# Patient Record
Sex: Female | Born: 1935 | Race: White | Hispanic: No | Smoking: Never smoker
Health system: Southern US, Community
[De-identification: ages and names within clinical notes are randomized; demographics above are authoritative.]

## PROBLEM LIST (undated history)

## (undated) DIAGNOSIS — L719 Rosacea, unspecified: Secondary | ICD-10-CM

## (undated) DIAGNOSIS — M8580 Other specified disorders of bone density and structure, unspecified site: Secondary | ICD-10-CM

## (undated) DIAGNOSIS — I471 Supraventricular tachycardia, unspecified: Secondary | ICD-10-CM

## (undated) DIAGNOSIS — I351 Nonrheumatic aortic (valve) insufficiency: Secondary | ICD-10-CM

## (undated) DIAGNOSIS — K589 Irritable bowel syndrome without diarrhea: Secondary | ICD-10-CM

## (undated) DIAGNOSIS — K5792 Diverticulitis of intestine, part unspecified, without perforation or abscess without bleeding: Secondary | ICD-10-CM

## (undated) DIAGNOSIS — E039 Hypothyroidism, unspecified: Secondary | ICD-10-CM

## (undated) DIAGNOSIS — M51369 Other intervertebral disc degeneration, lumbar region without mention of lumbar back pain or lower extremity pain: Secondary | ICD-10-CM

## (undated) DIAGNOSIS — M5136 Other intervertebral disc degeneration, lumbar region: Secondary | ICD-10-CM

## (undated) DIAGNOSIS — I7300 Raynaud's syndrome without gangrene: Secondary | ICD-10-CM

## (undated) DIAGNOSIS — I872 Venous insufficiency (chronic) (peripheral): Secondary | ICD-10-CM

## (undated) DIAGNOSIS — F39 Unspecified mood [affective] disorder: Secondary | ICD-10-CM

## (undated) HISTORY — DX: Rosacea, unspecified: L71.9

## (undated) HISTORY — DX: Hypothyroidism, unspecified: E03.9

## (undated) HISTORY — DX: Diverticulitis of intestine, part unspecified, without perforation or abscess without bleeding: K57.92

## (undated) HISTORY — DX: Other intervertebral disc degeneration, lumbar region without mention of lumbar back pain or lower extremity pain: M51.369

## (undated) HISTORY — DX: Other specified disorders of bone density and structure, unspecified site: M85.80

## (undated) HISTORY — DX: Irritable bowel syndrome, unspecified: K58.9

## (undated) HISTORY — DX: Raynaud's syndrome without gangrene: I73.00

## (undated) HISTORY — DX: Other intervertebral disc degeneration, lumbar region: M51.36

## (undated) HISTORY — PX: TONSILLECTOMY AND ADENOIDECTOMY: SUR1326

## (undated) HISTORY — DX: Unspecified mood (affective) disorder: F39

---
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
## 2003-07-30 ENCOUNTER — Encounter: Payer: Self-pay | Admitting: Internal Medicine

## 2009-02-23 DIAGNOSIS — F39 Unspecified mood [affective] disorder: Secondary | ICD-10-CM

## 2009-02-23 HISTORY — DX: Unspecified mood (affective) disorder: F39

## 2010-09-02 DIAGNOSIS — M810 Age-related osteoporosis without current pathological fracture: Secondary | ICD-10-CM | POA: Insufficient documentation

## 2010-09-02 DIAGNOSIS — H9319 Tinnitus, unspecified ear: Secondary | ICD-10-CM | POA: Insufficient documentation

## 2011-10-06 DIAGNOSIS — H409 Unspecified glaucoma: Secondary | ICD-10-CM | POA: Insufficient documentation

## 2012-02-24 DIAGNOSIS — C4491 Basal cell carcinoma of skin, unspecified: Secondary | ICD-10-CM

## 2012-02-24 HISTORY — DX: Basal cell carcinoma of skin, unspecified: C44.91

## 2014-11-26 DIAGNOSIS — M2060 Acquired deformities of toe(s), unspecified, unspecified foot: Secondary | ICD-10-CM | POA: Insufficient documentation

## 2015-02-24 HISTORY — PX: MOHS SURGERY: SUR867

## 2016-02-18 DIAGNOSIS — R748 Abnormal levels of other serum enzymes: Secondary | ICD-10-CM | POA: Insufficient documentation

## 2016-02-25 DIAGNOSIS — R748 Abnormal levels of other serum enzymes: Secondary | ICD-10-CM | POA: Diagnosis not present

## 2016-02-25 DIAGNOSIS — G8929 Other chronic pain: Secondary | ICD-10-CM | POA: Diagnosis not present

## 2016-02-25 DIAGNOSIS — Z91018 Allergy to other foods: Secondary | ICD-10-CM | POA: Diagnosis not present

## 2016-02-25 DIAGNOSIS — M5442 Lumbago with sciatica, left side: Secondary | ICD-10-CM | POA: Diagnosis not present

## 2016-02-25 DIAGNOSIS — M62830 Muscle spasm of back: Secondary | ICD-10-CM | POA: Diagnosis not present

## 2016-02-25 DIAGNOSIS — M7989 Other specified soft tissue disorders: Secondary | ICD-10-CM | POA: Diagnosis not present

## 2016-03-03 DIAGNOSIS — G8929 Other chronic pain: Secondary | ICD-10-CM | POA: Diagnosis not present

## 2016-03-03 DIAGNOSIS — M5442 Lumbago with sciatica, left side: Secondary | ICD-10-CM | POA: Diagnosis not present

## 2016-03-03 DIAGNOSIS — M62830 Muscle spasm of back: Secondary | ICD-10-CM | POA: Diagnosis not present

## 2016-03-05 DIAGNOSIS — R600 Localized edema: Secondary | ICD-10-CM | POA: Diagnosis not present

## 2016-03-10 DIAGNOSIS — R600 Localized edema: Secondary | ICD-10-CM | POA: Diagnosis not present

## 2016-03-10 DIAGNOSIS — I082 Rheumatic disorders of both aortic and tricuspid valves: Secondary | ICD-10-CM | POA: Diagnosis not present

## 2016-03-25 DIAGNOSIS — K87 Disorders of gallbladder, biliary tract and pancreas in diseases classified elsewhere: Secondary | ICD-10-CM | POA: Diagnosis not present

## 2016-03-25 DIAGNOSIS — R1084 Generalized abdominal pain: Secondary | ICD-10-CM | POA: Diagnosis not present

## 2016-03-25 DIAGNOSIS — K838 Other specified diseases of biliary tract: Secondary | ICD-10-CM | POA: Diagnosis not present

## 2016-03-25 DIAGNOSIS — K8689 Other specified diseases of pancreas: Secondary | ICD-10-CM | POA: Diagnosis not present

## 2016-04-02 DIAGNOSIS — G8929 Other chronic pain: Secondary | ICD-10-CM | POA: Diagnosis not present

## 2016-04-02 DIAGNOSIS — M5442 Lumbago with sciatica, left side: Secondary | ICD-10-CM | POA: Diagnosis not present

## 2016-04-02 DIAGNOSIS — M62830 Muscle spasm of back: Secondary | ICD-10-CM | POA: Diagnosis not present

## 2016-04-06 DIAGNOSIS — R7989 Other specified abnormal findings of blood chemistry: Secondary | ICD-10-CM | POA: Diagnosis not present

## 2016-04-06 DIAGNOSIS — K589 Irritable bowel syndrome without diarrhea: Secondary | ICD-10-CM | POA: Diagnosis not present

## 2016-04-06 DIAGNOSIS — K769 Liver disease, unspecified: Secondary | ICD-10-CM | POA: Diagnosis not present

## 2016-04-06 DIAGNOSIS — K838 Other specified diseases of biliary tract: Secondary | ICD-10-CM | POA: Diagnosis not present

## 2016-04-06 DIAGNOSIS — E039 Hypothyroidism, unspecified: Secondary | ICD-10-CM | POA: Diagnosis not present

## 2016-04-06 DIAGNOSIS — K5732 Diverticulitis of large intestine without perforation or abscess without bleeding: Secondary | ICD-10-CM | POA: Diagnosis not present

## 2016-04-19 DIAGNOSIS — N281 Cyst of kidney, acquired: Secondary | ICD-10-CM | POA: Diagnosis not present

## 2016-04-19 DIAGNOSIS — R7989 Other specified abnormal findings of blood chemistry: Secondary | ICD-10-CM | POA: Diagnosis not present

## 2016-04-19 DIAGNOSIS — K838 Other specified diseases of biliary tract: Secondary | ICD-10-CM | POA: Diagnosis not present

## 2016-04-19 DIAGNOSIS — K7689 Other specified diseases of liver: Secondary | ICD-10-CM | POA: Diagnosis not present

## 2016-04-19 DIAGNOSIS — K862 Cyst of pancreas: Secondary | ICD-10-CM | POA: Diagnosis not present

## 2016-04-30 DIAGNOSIS — M7989 Other specified soft tissue disorders: Secondary | ICD-10-CM | POA: Diagnosis not present

## 2016-04-30 DIAGNOSIS — R5382 Chronic fatigue, unspecified: Secondary | ICD-10-CM | POA: Diagnosis not present

## 2016-04-30 DIAGNOSIS — M2550 Pain in unspecified joint: Secondary | ICD-10-CM | POA: Diagnosis not present

## 2016-04-30 DIAGNOSIS — R768 Other specified abnormal immunological findings in serum: Secondary | ICD-10-CM | POA: Diagnosis not present

## 2016-04-30 DIAGNOSIS — R55 Syncope and collapse: Secondary | ICD-10-CM | POA: Diagnosis not present

## 2016-04-30 DIAGNOSIS — G20 Parkinson's disease: Secondary | ICD-10-CM | POA: Diagnosis not present

## 2016-05-25 DIAGNOSIS — R55 Syncope and collapse: Secondary | ICD-10-CM | POA: Diagnosis not present

## 2016-05-25 DIAGNOSIS — R609 Edema, unspecified: Secondary | ICD-10-CM | POA: Diagnosis not present

## 2016-05-25 DIAGNOSIS — R5383 Other fatigue: Secondary | ICD-10-CM | POA: Diagnosis not present

## 2016-06-02 DIAGNOSIS — I872 Venous insufficiency (chronic) (peripheral): Secondary | ICD-10-CM | POA: Diagnosis not present

## 2016-06-11 DIAGNOSIS — R5383 Other fatigue: Secondary | ICD-10-CM | POA: Diagnosis not present

## 2016-06-15 DIAGNOSIS — R5383 Other fatigue: Secondary | ICD-10-CM | POA: Diagnosis not present

## 2016-06-15 DIAGNOSIS — R55 Syncope and collapse: Secondary | ICD-10-CM | POA: Diagnosis not present

## 2016-06-16 DIAGNOSIS — H401131 Primary open-angle glaucoma, bilateral, mild stage: Secondary | ICD-10-CM | POA: Diagnosis not present

## 2016-07-06 DIAGNOSIS — I351 Nonrheumatic aortic (valve) insufficiency: Secondary | ICD-10-CM | POA: Diagnosis not present

## 2016-07-06 DIAGNOSIS — I471 Supraventricular tachycardia: Secondary | ICD-10-CM | POA: Diagnosis not present

## 2016-07-06 DIAGNOSIS — I872 Venous insufficiency (chronic) (peripheral): Secondary | ICD-10-CM | POA: Diagnosis not present

## 2016-07-06 DIAGNOSIS — R531 Weakness: Secondary | ICD-10-CM | POA: Diagnosis not present

## 2016-10-12 DIAGNOSIS — I872 Venous insufficiency (chronic) (peripheral): Secondary | ICD-10-CM | POA: Diagnosis not present

## 2016-10-12 DIAGNOSIS — I471 Supraventricular tachycardia: Secondary | ICD-10-CM | POA: Diagnosis not present

## 2016-10-12 DIAGNOSIS — I351 Nonrheumatic aortic (valve) insufficiency: Secondary | ICD-10-CM | POA: Diagnosis not present

## 2016-10-27 DIAGNOSIS — K588 Other irritable bowel syndrome: Secondary | ICD-10-CM | POA: Diagnosis not present

## 2016-10-27 DIAGNOSIS — M85849 Other specified disorders of bone density and structure, unspecified hand: Secondary | ICD-10-CM | POA: Diagnosis not present

## 2016-10-27 DIAGNOSIS — M7989 Other specified soft tissue disorders: Secondary | ICD-10-CM | POA: Diagnosis not present

## 2016-10-27 DIAGNOSIS — M51369 Other intervertebral disc degeneration, lumbar region without mention of lumbar back pain or lower extremity pain: Secondary | ICD-10-CM | POA: Insufficient documentation

## 2016-10-27 DIAGNOSIS — M8580 Other specified disorders of bone density and structure, unspecified site: Secondary | ICD-10-CM | POA: Diagnosis not present

## 2016-10-27 DIAGNOSIS — Z87898 Personal history of other specified conditions: Secondary | ICD-10-CM | POA: Diagnosis not present

## 2016-10-27 DIAGNOSIS — R5383 Other fatigue: Secondary | ICD-10-CM | POA: Diagnosis not present

## 2016-10-27 DIAGNOSIS — M19042 Primary osteoarthritis, left hand: Secondary | ICD-10-CM | POA: Diagnosis not present

## 2016-10-27 DIAGNOSIS — R768 Other specified abnormal immunological findings in serum: Secondary | ICD-10-CM | POA: Diagnosis not present

## 2016-10-27 DIAGNOSIS — M5416 Radiculopathy, lumbar region: Secondary | ICD-10-CM | POA: Insufficient documentation

## 2016-10-27 DIAGNOSIS — M19041 Primary osteoarthritis, right hand: Secondary | ICD-10-CM | POA: Diagnosis not present

## 2016-10-27 DIAGNOSIS — Z8719 Personal history of other diseases of the digestive system: Secondary | ICD-10-CM | POA: Diagnosis not present

## 2016-10-27 DIAGNOSIS — M150 Primary generalized (osteo)arthritis: Secondary | ICD-10-CM | POA: Diagnosis not present

## 2016-10-27 DIAGNOSIS — E039 Hypothyroidism, unspecified: Secondary | ICD-10-CM | POA: Diagnosis not present

## 2016-10-27 DIAGNOSIS — M25541 Pain in joints of right hand: Secondary | ICD-10-CM | POA: Insufficient documentation

## 2016-10-27 DIAGNOSIS — Z85828 Personal history of other malignant neoplasm of skin: Secondary | ICD-10-CM | POA: Diagnosis not present

## 2016-10-27 DIAGNOSIS — M25842 Other specified joint disorders, left hand: Secondary | ICD-10-CM | POA: Diagnosis not present

## 2016-10-27 DIAGNOSIS — M5136 Other intervertebral disc degeneration, lumbar region: Secondary | ICD-10-CM | POA: Insufficient documentation

## 2016-10-27 DIAGNOSIS — L719 Rosacea, unspecified: Secondary | ICD-10-CM | POA: Diagnosis not present

## 2016-10-27 DIAGNOSIS — M159 Polyosteoarthritis, unspecified: Secondary | ICD-10-CM | POA: Insufficient documentation

## 2016-10-27 DIAGNOSIS — M25841 Other specified joint disorders, right hand: Secondary | ICD-10-CM | POA: Diagnosis not present

## 2016-10-27 DIAGNOSIS — M180 Bilateral primary osteoarthritis of first carpometacarpal joints: Secondary | ICD-10-CM | POA: Diagnosis not present

## 2016-11-10 DIAGNOSIS — R5383 Other fatigue: Secondary | ICD-10-CM | POA: Diagnosis not present

## 2016-11-10 DIAGNOSIS — M8580 Other specified disorders of bone density and structure, unspecified site: Secondary | ICD-10-CM | POA: Diagnosis not present

## 2016-11-10 DIAGNOSIS — Z85828 Personal history of other malignant neoplasm of skin: Secondary | ICD-10-CM | POA: Diagnosis not present

## 2016-11-10 DIAGNOSIS — M5136 Other intervertebral disc degeneration, lumbar region: Secondary | ICD-10-CM | POA: Diagnosis not present

## 2016-11-10 DIAGNOSIS — R768 Other specified abnormal immunological findings in serum: Secondary | ICD-10-CM | POA: Diagnosis not present

## 2016-11-10 DIAGNOSIS — E039 Hypothyroidism, unspecified: Secondary | ICD-10-CM | POA: Diagnosis not present

## 2016-11-10 DIAGNOSIS — R778 Other specified abnormalities of plasma proteins: Secondary | ICD-10-CM | POA: Insufficient documentation

## 2016-11-10 DIAGNOSIS — K588 Other irritable bowel syndrome: Secondary | ICD-10-CM | POA: Diagnosis not present

## 2016-11-10 DIAGNOSIS — M150 Primary generalized (osteo)arthritis: Secondary | ICD-10-CM | POA: Diagnosis not present

## 2016-11-10 DIAGNOSIS — Z87898 Personal history of other specified conditions: Secondary | ICD-10-CM | POA: Diagnosis not present

## 2016-11-10 DIAGNOSIS — M5416 Radiculopathy, lumbar region: Secondary | ICD-10-CM | POA: Diagnosis not present

## 2016-11-10 DIAGNOSIS — R21 Rash and other nonspecific skin eruption: Secondary | ICD-10-CM | POA: Insufficient documentation

## 2016-11-10 DIAGNOSIS — M25541 Pain in joints of right hand: Secondary | ICD-10-CM | POA: Diagnosis not present

## 2016-11-10 DIAGNOSIS — Z8719 Personal history of other diseases of the digestive system: Secondary | ICD-10-CM | POA: Diagnosis not present

## 2016-11-16 DIAGNOSIS — Z85828 Personal history of other malignant neoplasm of skin: Secondary | ICD-10-CM | POA: Diagnosis not present

## 2016-11-16 DIAGNOSIS — L821 Other seborrheic keratosis: Secondary | ICD-10-CM | POA: Diagnosis not present

## 2016-11-16 DIAGNOSIS — D1801 Hemangioma of skin and subcutaneous tissue: Secondary | ICD-10-CM | POA: Diagnosis not present

## 2016-12-04 DIAGNOSIS — M8580 Other specified disorders of bone density and structure, unspecified site: Secondary | ICD-10-CM | POA: Diagnosis not present

## 2016-12-04 DIAGNOSIS — M7989 Other specified soft tissue disorders: Secondary | ICD-10-CM | POA: Diagnosis not present

## 2016-12-04 DIAGNOSIS — E039 Hypothyroidism, unspecified: Secondary | ICD-10-CM | POA: Diagnosis not present

## 2016-12-04 DIAGNOSIS — Z23 Encounter for immunization: Secondary | ICD-10-CM | POA: Diagnosis not present

## 2016-12-04 DIAGNOSIS — R748 Abnormal levels of other serum enzymes: Secondary | ICD-10-CM | POA: Diagnosis not present

## 2016-12-04 DIAGNOSIS — R768 Other specified abnormal immunological findings in serum: Secondary | ICD-10-CM | POA: Diagnosis not present

## 2016-12-29 DIAGNOSIS — H2513 Age-related nuclear cataract, bilateral: Secondary | ICD-10-CM | POA: Diagnosis not present

## 2016-12-29 DIAGNOSIS — H401131 Primary open-angle glaucoma, bilateral, mild stage: Secondary | ICD-10-CM | POA: Diagnosis not present

## 2016-12-29 DIAGNOSIS — Z79899 Other long term (current) drug therapy: Secondary | ICD-10-CM | POA: Diagnosis not present

## 2017-02-04 DIAGNOSIS — Z8719 Personal history of other diseases of the digestive system: Secondary | ICD-10-CM | POA: Diagnosis not present

## 2017-02-04 DIAGNOSIS — E039 Hypothyroidism, unspecified: Secondary | ICD-10-CM | POA: Diagnosis not present

## 2017-02-04 DIAGNOSIS — R768 Other specified abnormal immunological findings in serum: Secondary | ICD-10-CM | POA: Diagnosis not present

## 2017-02-04 DIAGNOSIS — M5416 Radiculopathy, lumbar region: Secondary | ICD-10-CM | POA: Diagnosis not present

## 2017-02-04 DIAGNOSIS — Z87898 Personal history of other specified conditions: Secondary | ICD-10-CM | POA: Diagnosis not present

## 2017-02-04 DIAGNOSIS — I7300 Raynaud's syndrome without gangrene: Secondary | ICD-10-CM | POA: Diagnosis not present

## 2017-02-04 DIAGNOSIS — M5136 Other intervertebral disc degeneration, lumbar region: Secondary | ICD-10-CM | POA: Diagnosis not present

## 2017-02-04 DIAGNOSIS — K588 Other irritable bowel syndrome: Secondary | ICD-10-CM | POA: Diagnosis not present

## 2017-02-04 DIAGNOSIS — M150 Primary generalized (osteo)arthritis: Secondary | ICD-10-CM | POA: Diagnosis not present

## 2017-02-04 DIAGNOSIS — R5383 Other fatigue: Secondary | ICD-10-CM | POA: Diagnosis not present

## 2017-02-04 DIAGNOSIS — M25541 Pain in joints of right hand: Secondary | ICD-10-CM | POA: Diagnosis not present

## 2017-02-04 DIAGNOSIS — Z85828 Personal history of other malignant neoplasm of skin: Secondary | ICD-10-CM | POA: Diagnosis not present

## 2017-02-19 DIAGNOSIS — Z1231 Encounter for screening mammogram for malignant neoplasm of breast: Secondary | ICD-10-CM | POA: Diagnosis not present

## 2017-04-09 DIAGNOSIS — K8689 Other specified diseases of pancreas: Secondary | ICD-10-CM | POA: Diagnosis not present

## 2017-04-09 DIAGNOSIS — K838 Other specified diseases of biliary tract: Secondary | ICD-10-CM | POA: Diagnosis not present

## 2017-04-09 DIAGNOSIS — K862 Cyst of pancreas: Secondary | ICD-10-CM | POA: Diagnosis not present

## 2017-04-19 DIAGNOSIS — I351 Nonrheumatic aortic (valve) insufficiency: Secondary | ICD-10-CM | POA: Diagnosis not present

## 2017-04-19 DIAGNOSIS — R531 Weakness: Secondary | ICD-10-CM | POA: Diagnosis not present

## 2017-04-19 DIAGNOSIS — I872 Venous insufficiency (chronic) (peripheral): Secondary | ICD-10-CM | POA: Diagnosis not present

## 2017-04-19 DIAGNOSIS — R0602 Shortness of breath: Secondary | ICD-10-CM | POA: Diagnosis not present

## 2017-04-19 DIAGNOSIS — I471 Supraventricular tachycardia: Secondary | ICD-10-CM | POA: Diagnosis not present

## 2017-04-20 DIAGNOSIS — H401131 Primary open-angle glaucoma, bilateral, mild stage: Secondary | ICD-10-CM | POA: Diagnosis not present

## 2017-04-20 DIAGNOSIS — H2513 Age-related nuclear cataract, bilateral: Secondary | ICD-10-CM | POA: Diagnosis not present

## 2017-04-20 DIAGNOSIS — Z79899 Other long term (current) drug therapy: Secondary | ICD-10-CM | POA: Diagnosis not present

## 2017-05-24 DIAGNOSIS — Z85828 Personal history of other malignant neoplasm of skin: Secondary | ICD-10-CM | POA: Diagnosis not present

## 2017-05-24 DIAGNOSIS — L821 Other seborrheic keratosis: Secondary | ICD-10-CM | POA: Diagnosis not present

## 2017-05-24 DIAGNOSIS — D225 Melanocytic nevi of trunk: Secondary | ICD-10-CM | POA: Diagnosis not present

## 2017-05-24 DIAGNOSIS — L820 Inflamed seborrheic keratosis: Secondary | ICD-10-CM | POA: Diagnosis not present

## 2017-05-31 DIAGNOSIS — Z87898 Personal history of other specified conditions: Secondary | ICD-10-CM | POA: Diagnosis not present

## 2017-05-31 DIAGNOSIS — F439 Reaction to severe stress, unspecified: Secondary | ICD-10-CM | POA: Insufficient documentation

## 2017-05-31 DIAGNOSIS — R768 Other specified abnormal immunological findings in serum: Secondary | ICD-10-CM | POA: Diagnosis not present

## 2017-05-31 DIAGNOSIS — E039 Hypothyroidism, unspecified: Secondary | ICD-10-CM | POA: Diagnosis not present

## 2017-05-31 DIAGNOSIS — M25541 Pain in joints of right hand: Secondary | ICD-10-CM | POA: Diagnosis not present

## 2017-05-31 DIAGNOSIS — R5383 Other fatigue: Secondary | ICD-10-CM | POA: Diagnosis not present

## 2017-05-31 DIAGNOSIS — M5136 Other intervertebral disc degeneration, lumbar region: Secondary | ICD-10-CM | POA: Diagnosis not present

## 2017-05-31 DIAGNOSIS — Z8719 Personal history of other diseases of the digestive system: Secondary | ICD-10-CM | POA: Insufficient documentation

## 2017-05-31 DIAGNOSIS — K588 Other irritable bowel syndrome: Secondary | ICD-10-CM | POA: Diagnosis not present

## 2017-05-31 DIAGNOSIS — M5416 Radiculopathy, lumbar region: Secondary | ICD-10-CM | POA: Diagnosis not present

## 2017-05-31 DIAGNOSIS — R1319 Other dysphagia: Secondary | ICD-10-CM | POA: Insufficient documentation

## 2017-05-31 DIAGNOSIS — Z85828 Personal history of other malignant neoplasm of skin: Secondary | ICD-10-CM | POA: Diagnosis not present

## 2017-05-31 DIAGNOSIS — M150 Primary generalized (osteo)arthritis: Secondary | ICD-10-CM | POA: Diagnosis not present

## 2017-05-31 DIAGNOSIS — I7300 Raynaud's syndrome without gangrene: Secondary | ICD-10-CM | POA: Diagnosis not present

## 2017-06-24 DIAGNOSIS — K229 Disease of esophagus, unspecified: Secondary | ICD-10-CM | POA: Diagnosis not present

## 2017-06-24 DIAGNOSIS — K3189 Other diseases of stomach and duodenum: Secondary | ICD-10-CM | POA: Diagnosis not present

## 2017-06-24 DIAGNOSIS — K228 Other specified diseases of esophagus: Secondary | ICD-10-CM | POA: Diagnosis not present

## 2017-06-24 DIAGNOSIS — K449 Diaphragmatic hernia without obstruction or gangrene: Secondary | ICD-10-CM | POA: Diagnosis not present

## 2017-06-24 DIAGNOSIS — R1310 Dysphagia, unspecified: Secondary | ICD-10-CM | POA: Diagnosis not present

## 2017-06-24 DIAGNOSIS — R109 Unspecified abdominal pain: Secondary | ICD-10-CM | POA: Diagnosis not present

## 2017-06-30 DIAGNOSIS — E039 Hypothyroidism, unspecified: Secondary | ICD-10-CM | POA: Diagnosis not present

## 2017-06-30 DIAGNOSIS — R748 Abnormal levels of other serum enzymes: Secondary | ICD-10-CM | POA: Diagnosis not present

## 2017-06-30 DIAGNOSIS — R5383 Other fatigue: Secondary | ICD-10-CM | POA: Diagnosis not present

## 2017-06-30 DIAGNOSIS — M25541 Pain in joints of right hand: Secondary | ICD-10-CM | POA: Diagnosis not present

## 2017-06-30 DIAGNOSIS — M25542 Pain in joints of left hand: Secondary | ICD-10-CM | POA: Diagnosis not present

## 2017-06-30 DIAGNOSIS — M8580 Other specified disorders of bone density and structure, unspecified site: Secondary | ICD-10-CM | POA: Diagnosis not present

## 2017-06-30 DIAGNOSIS — R768 Other specified abnormal immunological findings in serum: Secondary | ICD-10-CM | POA: Diagnosis not present

## 2017-06-30 DIAGNOSIS — I7300 Raynaud's syndrome without gangrene: Secondary | ICD-10-CM | POA: Diagnosis not present

## 2017-06-30 DIAGNOSIS — R1084 Generalized abdominal pain: Secondary | ICD-10-CM | POA: Diagnosis not present

## 2017-06-30 DIAGNOSIS — H409 Unspecified glaucoma: Secondary | ICD-10-CM | POA: Diagnosis not present

## 2017-07-21 DIAGNOSIS — F4320 Adjustment disorder, unspecified: Secondary | ICD-10-CM | POA: Diagnosis not present

## 2017-11-23 DIAGNOSIS — H401131 Primary open-angle glaucoma, bilateral, mild stage: Secondary | ICD-10-CM | POA: Diagnosis not present

## 2017-11-23 DIAGNOSIS — Z79899 Other long term (current) drug therapy: Secondary | ICD-10-CM | POA: Diagnosis not present

## 2017-11-23 DIAGNOSIS — H2513 Age-related nuclear cataract, bilateral: Secondary | ICD-10-CM | POA: Diagnosis not present

## 2017-12-08 DIAGNOSIS — L709 Acne, unspecified: Secondary | ICD-10-CM | POA: Diagnosis not present

## 2017-12-08 DIAGNOSIS — L814 Other melanin hyperpigmentation: Secondary | ICD-10-CM | POA: Diagnosis not present

## 2017-12-08 DIAGNOSIS — L719 Rosacea, unspecified: Secondary | ICD-10-CM | POA: Diagnosis not present

## 2017-12-09 DIAGNOSIS — Z23 Encounter for immunization: Secondary | ICD-10-CM | POA: Diagnosis not present

## 2017-12-15 ENCOUNTER — Ambulatory Visit (INDEPENDENT_AMBULATORY_CARE_PROVIDER_SITE_OTHER): Payer: Medicare Other | Admitting: Internal Medicine

## 2017-12-15 ENCOUNTER — Encounter: Payer: Self-pay | Admitting: Internal Medicine

## 2017-12-15 ENCOUNTER — Ambulatory Visit: Payer: Self-pay | Admitting: Internal Medicine

## 2017-12-15 ENCOUNTER — Encounter (INDEPENDENT_AMBULATORY_CARE_PROVIDER_SITE_OTHER): Payer: Self-pay

## 2017-12-15 VITALS — BP 128/74 | HR 65 | Temp 97.6°F | Ht 64.75 in | Wt 103.0 lb

## 2017-12-15 DIAGNOSIS — E039 Hypothyroidism, unspecified: Secondary | ICD-10-CM

## 2017-12-15 DIAGNOSIS — L719 Rosacea, unspecified: Secondary | ICD-10-CM | POA: Diagnosis not present

## 2017-12-15 DIAGNOSIS — E441 Mild protein-calorie malnutrition: Secondary | ICD-10-CM

## 2017-12-15 DIAGNOSIS — F39 Unspecified mood [affective] disorder: Secondary | ICD-10-CM | POA: Insufficient documentation

## 2017-12-15 DIAGNOSIS — K589 Irritable bowel syndrome without diarrhea: Secondary | ICD-10-CM | POA: Diagnosis not present

## 2017-12-15 DIAGNOSIS — B3781 Candidal esophagitis: Secondary | ICD-10-CM | POA: Diagnosis not present

## 2017-12-15 DIAGNOSIS — I7300 Raynaud's syndrome without gangrene: Secondary | ICD-10-CM

## 2017-12-15 DIAGNOSIS — K2970 Gastritis, unspecified, without bleeding: Secondary | ICD-10-CM

## 2017-12-15 LAB — CBC
HCT: 39.3 % (ref 36.0–46.0)
HEMOGLOBIN: 13.3 g/dL (ref 12.0–15.0)
MCHC: 33.8 g/dL (ref 30.0–36.0)
MCV: 98.3 fl (ref 78.0–100.0)
Platelets: 200 10*3/uL (ref 150.0–400.0)
RBC: 3.99 Mil/uL (ref 3.87–5.11)
RDW: 12.6 % (ref 11.5–15.5)
WBC: 5.4 10*3/uL (ref 4.0–10.5)

## 2017-12-15 LAB — COMPREHENSIVE METABOLIC PANEL
ALT: 24 U/L (ref 0–35)
AST: 27 U/L (ref 0–37)
Albumin: 4.8 g/dL (ref 3.5–5.2)
Alkaline Phosphatase: 76 U/L (ref 39–117)
BILIRUBIN TOTAL: 0.4 mg/dL (ref 0.2–1.2)
BUN: 15 mg/dL (ref 6–23)
CALCIUM: 10.2 mg/dL (ref 8.4–10.5)
CHLORIDE: 101 meq/L (ref 96–112)
CO2: 34 meq/L — AB (ref 19–32)
CREATININE: 0.68 mg/dL (ref 0.40–1.20)
GFR: 88.24 mL/min (ref >60.00)
GLUCOSE: 94 mg/dL (ref 70–99)
Potassium: 4.6 mEq/L (ref 3.5–5.1)
Sodium: 140 mEq/L (ref 135–145)
Total Protein: 7.5 g/dL (ref 6.0–8.3)

## 2017-12-15 LAB — T4, FREE: Free T4: 0.78 ng/dL (ref 0.60–1.60)

## 2017-12-15 LAB — TSH: TSH: 0.91 u[IU]/mL (ref 0.35–4.50)

## 2017-12-15 MED ORDER — FLUCONAZOLE 100 MG PO TABS: 100.0000 mg | ORAL_TABLET | Freq: Every day | ORAL | 3 refills | Status: DC

## 2017-12-15 MED ORDER — FAMOTIDINE 20 MG PO TABS: 20.0000 mg | ORAL_TABLET | Freq: Two times a day (BID) | ORAL | 11 refills | Status: DC

## 2017-12-15 NOTE — Assessment & Plan Note (Signed)
Recent weight loss Discussed proper eating, etc

## 2017-12-15 NOTE — Assessment & Plan Note (Signed)
Guilt, grieving, etc Discussed considering counseling--but she doesn't "open up" easily

## 2017-12-15 NOTE — Assessment & Plan Note (Signed)
Need to check labs to be sure euthryoid

## 2017-12-15 NOTE — Progress Notes (Signed)
Subjective:    Patient ID: Robyn Washington, female    DOB: [birth year], 83 y.o.   MRN: [id number]  HPI Here to establish care I have met her caring for her husband at [facility]---he recently died She moved into one of the garden homes  Had increasing caregiver stress --which peaked last year in fall Was seeing physician about this Some increased LFTs---no alcohol. Got MRI --cysts or hamartomas Still has nausea--relates to the stress  Fatigue and arthralgias ANA positive and Raynaud's They tried plaquenil--didn't help She feels it is her immune system---reacting to a lot of food, etc EGD done--plaques seen suspicious for candida. Got fluconazole and she felt better after this for a while  Chronic abdominal problems Severe IBS at times Required hospitalization in 2013 Duodenal ulcer decades ago  Rosacea chronically Uses the doxycycline rarely for flares (like 3 days in a row and then none for months)  Hypothyroidism goes back to teenage years Long standing replacement  Also with glaucoma On topical Rx for this  Fairly socially isolated since her husband worsening Stuck to the caregiver role Plans to work on getting involved in things again Was always big on exercising---very limited with husband's illness. Has restarted with this No local church community --but faith is very important to her  Current Outpatient Medications on File Prior to Visit  Medication Sig Dispense Refill  . Calcium Carbonate-Vit D-Min (CALCIUM 600+D PLUS MINERALS) 600-400 MG-UNIT TABS Take by mouth.    . doxycycline (VIBRAMYCIN) 100 MG capsule Take by mouth.    . fexofenadine (ALLEGRA) 180 MG tablet Take by mouth.    . levothyroxine (SYNTHROID) 75 MCG tablet TAKE 1 TABLET BY MOUTH  DAILY    . Multiple Vitamins-Minerals (CENTRUM SILVER) tablet Take by mouth.    . timolol (TIMOPTIC) 0.5 % ophthalmic solution      No current facility-administered medications on file prior to visit.      Allergies  Allergen Reactions  . Bioflavonoids Swelling    Swelling of lips Swelling of lips   . Banana   . Barley Grass Other (See Comments)    Other reaction(s): Other (See Comments)  . Pear Other (See Comments)    Other reaction(s): Other (See Comments) Feels bad   . Pineapple   . Rice Other (See Comments)    Rhinitis. Rhinitis.   . Rye Grass Flower Pollen Extract  [Gramineae Pollens] Other (See Comments)  . Wheat Bran Other (See Comments)  . Hydroxychloroquine Rash    Past Medical History:  Diagnosis Date  . Diverticulitis    hospitalized 2013  . Hypothyroidism   . IBS (irritable bowel syndrome)   . Lumbar degenerative disc disease   . Mood disorder ([place]) 2011   adjustment issues mostly to moving and husband's illness  . Osteopenia   . Raynaud's syndrome   . Rosacea     Past Surgical History:  Procedure Laterality Date  . PILONIDAL CYST EXCISION  1958  . TONSILLECTOMY AND ADENOIDECTOMY      Family History  Problem Relation Age of Onset  . Diabetes Mother   . Alcohol abuse Father   . Thyroid cancer Daughter     Social History   Socioeconomic History  . Marital status: Widowed    Spouse name: Not on file  . Number of children: 2  . Years of education: Not on file  . Highest education level: Not on file  Occupational History  . Occupation: Web designer    Comment: [employer]  Social Needs  . Financial resource strain: Not on file  . Food insecurity:    Worry: Not on file    Inability: Not on file  . Transportation needs:    Medical: Not on file    Non-medical: Not on file  Tobacco Use  . Smoking status: Never Smoker  . Smokeless tobacco: Never Used  Substance and Sexual Activity  . Alcohol use: Never    Frequency: Never  . Drug use: Not on file  . Sexual activity: Not on file  Lifestyle  . Physical activity:    Days per week: Not on file    Minutes per session: Not on file  . Stress: Not on file   Relationships  . Social connections:    Talks on phone: Not on file    Gets together: Not on file    Attends religious service: Not on file    Active member of club or organization: Not on file    Attends meetings of clubs or organizations: Not on file    Relationship status: Not on file  . Intimate partner violence:    Fear of current or ex partner: Not on file    Emotionally abused: Not on file    Physically abused: Not on file    Forced sexual activity: Not on file  Other Topics Concern  . Not on file  Social History Narrative   2 daughters      Has living will   Daughter Robyn Washington Warehouse manager) then Robyn Washington [facility]) have health care POA   Would accept resuscitation but no prolonged ventilation or tube feeds   Review of Systems  Constitutional:       Thinks she is eating okay--but thinks she may have lost some weight  HENT: Negative for hearing loss.        Teeth okay---establishing with Dr Eugenie Birks  Respiratory: Negative for cough, chest tightness and shortness of breath.   Cardiovascular: Negative for chest pain and palpitations.       Knows heart skips but doesn't feel it Uses compression hose  Gastrointestinal: Positive for abdominal distention and abdominal pain.       No heartburn but does notice swallowing issues Did have inflammation noted in stomach at EGD Bloating, etc  Genitourinary: Negative for dysuria and hematuria.  Musculoskeletal:       Spasm and pain in back at times Aleve may have helped  Uses heating pad  Skin:       Doxy prn for rosacea Past cancer  Neurological: Negative for dizziness, syncope, light-headedness and headaches.       Feels weak  Psychiatric/Behavioral: Negative for sleep disturbance.       Doesn't think she is truly depressed       Objective:   Physical Exam  Constitutional: She is oriented to person, place, and time. No distress.  Slight build Poor muscle defnition  HENT:  Mouth/Throat: Oropharynx is clear and moist.  No oropharyngeal exudate.  Neck: No thyromegaly present.  Cardiovascular: Normal rate, regular rhythm, normal heart sounds and intact distal pulses. Exam reveals no gallop.  No murmur heard. Respiratory: Effort normal and breath sounds normal. No respiratory distress. She has no wheezes. She has no rales.  GI: Soft. Bowel sounds are normal. She exhibits no distension. There is no tenderness. There is no rebound and no guarding.  Musculoskeletal: She exhibits no edema or tenderness.  No synovitis  Lymphadenopathy:    She has no cervical adenopathy.  Neurological:  She is alert and oriented to person, place, and time.  Skin: No rash noted. No erythema.  Psychiatric: She has a normal mood and affect. Her behavior is normal.           Assessment & Plan:

## 2017-12-15 NOTE — Assessment & Plan Note (Signed)
Plaques seen on EGD Felt better after fluconazole---will try this again

## 2017-12-15 NOTE — Assessment & Plan Note (Signed)
Chronic issues Worse with stress

## 2017-12-15 NOTE — Assessment & Plan Note (Signed)
Seen on EGD Unclear how much of this is causing her symptoms Will try famotidine for now

## 2017-12-30 ENCOUNTER — Telehealth: Payer: Self-pay | Admitting: Internal Medicine

## 2017-12-30 NOTE — Telephone Encounter (Signed)
Please let her know that it is debatable about whether continuing mammograms after age 83 is a good idea. It has only been a year also---- and I think every other year is fine. She should probably wait on the mammogram till we discuss it again Similarly, her bone density was not quite 3 years ago and only showed osteopenia. Doing this again is unlikely to change any plan----I recommend discussing this also before proceeding.  I reviewed her last MRI and the recommendation is to continue yearly checks for now to monitor the pancreatic cyst (so I am okay with proceeding with this)

## 2017-12-30 NOTE — Telephone Encounter (Signed)
Best number [phone]  Pt called concerned about her appointments coming up  @ [facility]    She has a mammogram schedule 02/21/18 and its time to have her bone density she had her last one @ [facility] and wanted to know if she could go back there to have this done.  She knows that having the bone density done at the same place is best to compare. Will need order for bone density.  She also has a liver dr.  Dr Monica Martinez @ [facility] She doesn't have a follow up appointment scheduled with him.  But does have a repeat MRI scheduled @ [facility] 04/08/18.  She wanted to make sure you supported this so insurance will pay.    She kept referring back to my chart message about colonoscopy and cancer screening not done after 82.  Patient has appointment with you 11/25.

## 2017-12-31 NOTE — Telephone Encounter (Signed)
Spoke to pt. She said she needs to process all of this and will discuss it at the [place].

## 2018-01-17 ENCOUNTER — Ambulatory Visit: Payer: Medicare Other | Admitting: Internal Medicine

## 2018-01-24 DIAGNOSIS — H0102A Squamous blepharitis right eye, upper and lower eyelids: Secondary | ICD-10-CM | POA: Diagnosis not present

## 2018-01-24 DIAGNOSIS — H16223 Keratoconjunctivitis sicca, not specified as Sjogren's, bilateral: Secondary | ICD-10-CM | POA: Diagnosis not present

## 2018-01-24 NOTE — Telephone Encounter (Signed)
Pt called to ck on mychart request for refill synthroid 75 mcg taking one daily name brand to [facility]. Pt requesting refill so does not run out of med.Please advise.

## 2018-01-26 MED ORDER — SYNTHROID 75 MCG PO TABS: 75.0000 ug | ORAL_TABLET | Freq: Every day | ORAL | 3 refills | Status: DC

## 2018-01-26 NOTE — Telephone Encounter (Signed)
Pt called back to request synthroid to [facility]. Robyn Washington CMA said she will send today. Pt very appreciative and voiced understanding.

## 2018-01-28 ENCOUNTER — Encounter: Payer: Self-pay | Admitting: Internal Medicine

## 2018-01-28 ENCOUNTER — Ambulatory Visit (INDEPENDENT_AMBULATORY_CARE_PROVIDER_SITE_OTHER): Payer: Medicare Other | Admitting: Internal Medicine

## 2018-01-28 VITALS — BP 124/84 | HR 67 | Temp 97.6°F | Ht 64.75 in | Wt 106.0 lb

## 2018-01-28 DIAGNOSIS — K589 Irritable bowel syndrome without diarrhea: Secondary | ICD-10-CM | POA: Diagnosis not present

## 2018-01-28 DIAGNOSIS — F39 Unspecified mood [affective] disorder: Secondary | ICD-10-CM

## 2018-01-28 NOTE — Assessment & Plan Note (Signed)
Ongoing grieving Not great about getting out socially but has seen some of the people she knew from health care (when daughter was here)---but progressing  Doesn't want counselor No Rx for this

## 2018-01-28 NOTE — Assessment & Plan Note (Signed)
Ongoing symptoms Discussed regular miralax to keep bowels moving No other Rx for now

## 2018-01-28 NOTE — Progress Notes (Signed)
Subjective:    Patient ID: Robyn Washington, female    DOB: 10-14-1934, 83 y.o.   MRN: [id number]  HPI He for follow up of multiple issues She feels "my immune system is acting up" IBS is worse, notices the Raynaud's more, etc Ongoing abdominal pain Thinks the repeat Rx with fluconazole --helped upper symptoms Gets burning and distention, then bloating in epigastrium after eating Has tried miralax--it helps (3 times over 6 days) Knows that all her stress goes to her GI system  Still having a hard time Moved, adjusting to caregiving, then putting husband then dying Does go to exercise--but not working to meet people Cries a lot---various triggers Goes to [employer] a lot--even though she doesn't feel good when she does  Current Outpatient Medications on File Prior to Visit  Medication Sig Dispense Refill  . Calcium Carbonate-Vit D-Min (CALCIUM 600+D PLUS MINERALS) 600-400 MG-UNIT TABS Take by mouth.    . doxycycline (VIBRAMYCIN) 100 MG capsule Take by mouth.    . famotidine (PEPCID) 20 MG tablet Take 1 tablet (20 mg total) by mouth 2 (two) times daily. 60 tablet 11  . fexofenadine (ALLEGRA) 180 MG tablet Take by mouth.    . Multiple Vitamins-Minerals (CENTRUM SILVER) tablet Take by mouth.    . SYNTHROID 75 MCG tablet Take 1 tablet (75 mcg total) by mouth daily. 90 tablet 3  . timolol (TIMOPTIC) 0.5 % ophthalmic solution      No current facility-administered medications on file prior to visit.     Allergies  Allergen Reactions  . Bioflavonoids Swelling    Swelling of lips Swelling of lips   . Banana   . Barley Grass Other (See Comments)    Other reaction(s): Other (See Comments)  . Pear Other (See Comments)    Other reaction(s): Other (See Comments) Feels bad   . Pineapple   . Rice Other (See Comments)    Rhinitis. Rhinitis.   . Rye Grass Flower Pollen Extract  [Gramineae Pollens] Other (See Comments)  . Wheat Bran Other (See Comments)  . Hydroxychloroquine Rash     Past Medical History:  Diagnosis Date  . Diverticulitis    hospitalized 2013  . Hypothyroidism   . IBS (irritable bowel syndrome)   . Lumbar degenerative disc disease   . Mood disorder ([place]) 2011   adjustment issues mostly to moving and husband's illness  . Osteopenia   . Raynaud's syndrome   . Rosacea     Past Surgical History:  Procedure Laterality Date  . MOHS SURGERY  2017   BSC?  Marland Kitchen PILONIDAL CYST EXCISION  1958  . TONSILLECTOMY AND ADENOIDECTOMY      Family History  Problem Relation Age of Onset  . Diabetes Mother   . Alcohol abuse Father   . Thyroid cancer Daughter     Social History   Socioeconomic History  . Marital status: Widowed    Spouse name: Not on file  . Number of children: 2  . Years of education: Not on file  . Highest education level: Not on file  Occupational History  . Occupation: Web designer    Comment: [place]  . Financial resource strain: Not on file  . Food insecurity:    Worry: Not on file    Inability: Not on file  . Transportation needs:    Medical: Not on file    Non-medical: Not on file  Tobacco Use  . Smoking status: Never Smoker  . Smokeless tobacco:  Never Used  Substance and Sexual Activity  . Alcohol use: Never    Frequency: Never  . Drug use: Not on file  . Sexual activity: Not on file  Lifestyle  . Physical activity:    Days per week: Not on file    Minutes per session: Not on file  . Stress: Not on file  Relationships  . Social connections:    Talks on phone: Not on file    Gets together: Not on file    Attends religious service: Not on file    Active member of club or organization: Not on file    Attends meetings of clubs or organizations: Not on file    Relationship status: Not on file  . Intimate partner violence:    Fear of current or ex partner: Not on file    Emotionally abused: Not on file    Physically abused: Not on file    Forced sexual activity: Not  on file  Other Topics Concern  . Not on file  Social History Narrative   2 daughters      Has living will   Daughter Benjamine Mola Warehouse manager) then Ivin Booty [facility]) have health care POA   Would accept resuscitation but no prolonged ventilation or tube feeds   Review of Systems Eating okay--doesn't like the ensure Weight is up slightly    Objective:   Physical Exam  Constitutional: No distress.  Cardiovascular: Normal rate, regular rhythm and normal heart sounds. Exam reveals no gallop.  No murmur heard. Respiratory: Effort normal and breath sounds normal. No respiratory distress. She has no wheezes. She has no rales.  GI: Soft.  Tenderness in RLQ but no rebound and not new  Musculoskeletal: She exhibits no edema.           Assessment & Plan:

## 2018-02-03 DIAGNOSIS — R109 Unspecified abdominal pain: Secondary | ICD-10-CM

## 2018-02-09 DIAGNOSIS — H2513 Age-related nuclear cataract, bilateral: Secondary | ICD-10-CM | POA: Diagnosis not present

## 2018-02-21 ENCOUNTER — Telehealth: Payer: Self-pay | Admitting: Internal Medicine

## 2018-02-21 NOTE — Telephone Encounter (Signed)
Spoke to pt. She will try the Miralax 1-2 times a day until regular then 1 a day. She is scheduled to see GI in the next month.

## 2018-02-21 NOTE — Telephone Encounter (Signed)
Pt called concerning IBS. She has had a recent flare up and is wanting to know if there is anything OTC that she can get that will give relief. She is bloated and has discomfort after any foods.

## 2018-02-21 NOTE — Telephone Encounter (Signed)
Probiotics sometimes will help. Some people find fiber supplements helpful---but not consistently  If constipated, miralax may help as well

## 2018-03-04 DIAGNOSIS — M8588 Other specified disorders of bone density and structure, other site: Secondary | ICD-10-CM | POA: Diagnosis not present

## 2018-03-04 DIAGNOSIS — R109 Unspecified abdominal pain: Secondary | ICD-10-CM | POA: Diagnosis not present

## 2018-03-04 DIAGNOSIS — K5900 Constipation, unspecified: Secondary | ICD-10-CM | POA: Diagnosis not present

## 2018-03-17 DIAGNOSIS — K229 Disease of esophagus, unspecified: Secondary | ICD-10-CM | POA: Diagnosis not present

## 2018-03-17 DIAGNOSIS — K449 Diaphragmatic hernia without obstruction or gangrene: Secondary | ICD-10-CM | POA: Diagnosis not present

## 2018-03-30 DIAGNOSIS — R109 Unspecified abdominal pain: Secondary | ICD-10-CM | POA: Diagnosis not present

## 2018-03-30 DIAGNOSIS — K228 Other specified diseases of esophagus: Secondary | ICD-10-CM | POA: Diagnosis not present

## 2018-03-30 DIAGNOSIS — R1314 Dysphagia, pharyngoesophageal phase: Secondary | ICD-10-CM | POA: Diagnosis not present

## 2018-03-30 DIAGNOSIS — R1310 Dysphagia, unspecified: Secondary | ICD-10-CM | POA: Diagnosis not present

## 2018-04-01 DIAGNOSIS — K862 Cyst of pancreas: Secondary | ICD-10-CM | POA: Diagnosis not present

## 2018-04-12 DIAGNOSIS — R110 Nausea: Secondary | ICD-10-CM | POA: Insufficient documentation

## 2018-04-12 DIAGNOSIS — E039 Hypothyroidism, unspecified: Secondary | ICD-10-CM | POA: Diagnosis not present

## 2018-04-12 DIAGNOSIS — M5136 Other intervertebral disc degeneration, lumbar region: Secondary | ICD-10-CM | POA: Diagnosis not present

## 2018-04-12 DIAGNOSIS — K588 Other irritable bowel syndrome: Secondary | ICD-10-CM | POA: Diagnosis not present

## 2018-04-12 DIAGNOSIS — Z85828 Personal history of other malignant neoplasm of skin: Secondary | ICD-10-CM | POA: Diagnosis not present

## 2018-04-12 DIAGNOSIS — R768 Other specified abnormal immunological findings in serum: Secondary | ICD-10-CM | POA: Diagnosis not present

## 2018-04-12 DIAGNOSIS — I7300 Raynaud's syndrome without gangrene: Secondary | ICD-10-CM | POA: Diagnosis not present

## 2018-04-12 DIAGNOSIS — M150 Primary generalized (osteo)arthritis: Secondary | ICD-10-CM | POA: Diagnosis not present

## 2018-04-12 DIAGNOSIS — Z87898 Personal history of other specified conditions: Secondary | ICD-10-CM | POA: Diagnosis not present

## 2018-04-12 DIAGNOSIS — M25541 Pain in joints of right hand: Secondary | ICD-10-CM | POA: Diagnosis not present

## 2018-04-12 DIAGNOSIS — M8580 Other specified disorders of bone density and structure, unspecified site: Secondary | ICD-10-CM | POA: Diagnosis not present

## 2018-04-12 DIAGNOSIS — R5383 Other fatigue: Secondary | ICD-10-CM | POA: Diagnosis not present

## 2018-04-12 DIAGNOSIS — Z8719 Personal history of other diseases of the digestive system: Secondary | ICD-10-CM | POA: Diagnosis not present

## 2018-05-11 ENCOUNTER — Ambulatory Visit: Payer: Medicare Other | Admitting: Internal Medicine

## 2018-06-13 ENCOUNTER — Ambulatory Visit: Payer: Medicare Other | Admitting: Internal Medicine

## 2018-06-21 ENCOUNTER — Ambulatory Visit (INDEPENDENT_AMBULATORY_CARE_PROVIDER_SITE_OTHER): Payer: Medicare Other | Admitting: Internal Medicine

## 2018-06-21 ENCOUNTER — Encounter: Payer: Self-pay | Admitting: Internal Medicine

## 2018-06-21 DIAGNOSIS — M5432 Sciatica, left side: Secondary | ICD-10-CM | POA: Diagnosis not present

## 2018-06-21 DIAGNOSIS — M5430 Sciatica, unspecified side: Secondary | ICD-10-CM | POA: Insufficient documentation

## 2018-06-21 MED ORDER — TIZANIDINE HCL 2 MG PO TABS: 2.0000 mg | ORAL_TABLET | Freq: Three times a day (TID) | ORAL | 0 refills | Status: DC | PRN

## 2018-06-21 NOTE — Assessment & Plan Note (Signed)
I am not sure this is truly sciatica She mentions a L2 compression abnormality on past MRI of abdomen---but doesn't have symptoms of acute fracture She may have a true lumbar radiculopathy---as opposed to sciatica Discussed at length her symptoms, duration, etc  She has eschewed medications so hasn't even had trial of tylenol Asked her to continue heat as needed Add tylenol 500-1000mg  tid regularly Will Rx tizanidine for prn use for spasm (discussed possible side effects and recommended mostly at bedtime) If not improving, would consider 3 day trial of prednisone next week

## 2018-06-21 NOTE — Progress Notes (Signed)
Subjective:    Patient ID: Robyn Washington, female    DOB: [birth year], 83 y.o.   MRN: [id number]  HPI Visit for back problems We tried to set up video but she could not manage it  Interactive audio and video telecommunications were attempted between this provider and patient, however failed, due to patient having technical difficulties OR patient did not have access to video capability.  We continued and completed visit with audio only.   Virtual Visit via Telephone Note  I connected with Robyn Washington on 06/21/18 at 11:15 AM EDT by telephone and verified that I am speaking with the correct person using two identifiers.   I discussed the limitations, risks, security and privacy concerns of performing an evaluation and management service by telephone and the availability of in person appointments. I also discussed with the patient that there may be a patient responsible charge related to this service. The patient expressed understanding and agreed to proceed.   History of Present Illness: She did see GI doctor---had barium swallow and esophageal manometry Has ineffective esophageal motility pepcid helped symptoms but made her constipated Then changed to prilosec---and definitely helped Then she was concerned by potential side effects (especially for her bones and lupus risk) So went back to pepcid  She has autoimmune disease---Raynaud's disease and took plaquenil (but had to stop due to vertigo)  Has had some chronic back issues---but usually can manage it Known degenerative disc disease--and has had intermittent sciatica This has been ongoing for several months but now worse Afraid to use aleve--because of stomach Now having muscle spasms in her back ---and "they will grab and cause twisting into my abdomen" This has limited her walking and exercise Pain is down left side--back of leg to foot. Also in low left back Never goes away----worse when she gets the spasm feeling. May  occur if walking or lifting (still unpacking) Gait and balance off with the spasm Feels weak in the legs---with the spasms Does use heat Hasn't tried tylenol This is affecting her motivation and adding to sadness   Current Outpatient Medications on File Prior to Visit  Medication Sig Dispense Refill  . Calcium Carbonate-Vit D-Min (CALCIUM 600+D PLUS MINERALS) 600-400 MG-UNIT TABS Take by mouth.    . famotidine (PEPCID) 20 MG tablet Take 1 tablet (20 mg total) by mouth 2 (two) times daily. 60 tablet 11  . fexofenadine (ALLEGRA) 180 MG tablet Take by mouth.    . Multiple Vitamins-Minerals (CENTRUM SILVER) tablet Take by mouth.    . SYNTHROID 75 MCG tablet Take 1 tablet (75 mcg total) by mouth daily. 90 tablet 3  . timolol (TIMOPTIC) 0.5 % ophthalmic solution      No current facility-administered medications on file prior to visit.     Allergies  Allergen Reactions  . Bioflavonoids Swelling    Swelling of lips Swelling of lips   . Banana   . Barley Grass Other (See Comments)    Other reaction(s): Other (See Comments)  . Pear Other (See Comments)    Other reaction(s): Other (See Comments) Feels bad   . Pineapple   . Rice Other (See Comments)    Rhinitis. Rhinitis.   . Rye Grass Flower Pollen Extract  [Gramineae Pollens] Other (See Comments)  . Wheat Bran Other (See Comments)  . Hydroxychloroquine Rash    Past Medical History:  Diagnosis Date  . Diverticulitis    hospitalized 2013  . Hypothyroidism   . IBS (irritable bowel syndrome)   .  Lumbar degenerative disc disease   . Mood disorder ([place]) 2011   adjustment issues mostly to moving and husband's illness  . Osteopenia   . Raynaud's syndrome   . Rosacea     Past Surgical History:  Procedure Laterality Date  . MOHS SURGERY  2017   BSC?  Marland Kitchen PILONIDAL CYST EXCISION  1958  . TONSILLECTOMY AND ADENOIDECTOMY      Family History  Problem Relation Age of Onset  . Diabetes Mother   . Alcohol abuse Father   .  Thyroid cancer Daughter     Social History   Socioeconomic History  . Marital status: Widowed    Spouse name: Not on file  . Number of children: 2  . Years of education: Not on file  . Highest education level: Not on file  Occupational History  . Occupation: Web designer    Comment: [place]  . Financial resource strain: Not on file  . Food insecurity:    Worry: Not on file    Inability: Not on file  . Transportation needs:    Medical: Not on file    Non-medical: Not on file  Tobacco Use  . Smoking status: Never Smoker  . Smokeless tobacco: Never Used  Substance and Sexual Activity  . Alcohol use: Never    Frequency: Never  . Drug use: Not on file  . Sexual activity: Not on file  Lifestyle  . Physical activity:    Days per week: Not on file    Minutes per session: Not on file  . Stress: Not on file  Relationships  . Social connections:    Talks on phone: Not on file    Gets together: Not on file    Attends religious service: Not on file    Active member of club or organization: Not on file    Attends meetings of clubs or organizations: Not on file    Relationship status: Not on file  . Intimate partner violence:    Fear of current or ex partner: Not on file    Emotionally abused: Not on file    Physically abused: Not on file    Forced sexual activity: Not on file  Other Topics Concern  . Not on file  Social History Narrative   2 daughters      Has living will   Daughter Benjamine Mola Warehouse manager) then Ivin Booty [facility]) have health care POA   Would accept resuscitation but no prolonged ventilation or tube feeds   Observations/Objective: Normal conversation Voices "sadness" due to the worsened back (as well as the imposed isolation with COVID)   Assessment and Plan:   Follow Up Instructions:    I discussed the assessment and treatment plan with the patient. The patient was provided an opportunity to ask questions and  all were answered. The patient agreed with the plan and demonstrated an understanding of the instructions.   The patient was advised to call back or seek an in-person evaluation if the symptoms worsen or if the condition fails to improve as anticipated.  I provided 22 minutes of non-face-to-face time during this encounter.   Robyn Simpler, MD    Review of Systems     Objective:   Physical Exam         Assessment & Plan:

## 2018-06-28 DIAGNOSIS — T07XXXA Unspecified multiple injuries, initial encounter: Secondary | ICD-10-CM | POA: Diagnosis not present

## 2018-06-28 DIAGNOSIS — L300 Nummular dermatitis: Secondary | ICD-10-CM | POA: Diagnosis not present

## 2018-06-28 DIAGNOSIS — L718 Other rosacea: Secondary | ICD-10-CM | POA: Diagnosis not present

## 2018-06-28 DIAGNOSIS — I8393 Asymptomatic varicose veins of bilateral lower extremities: Secondary | ICD-10-CM | POA: Diagnosis not present

## 2018-06-28 DIAGNOSIS — D1800 Hemangioma unspecified site: Secondary | ICD-10-CM | POA: Diagnosis not present

## 2018-06-28 DIAGNOSIS — Z1283 Encounter for screening for malignant neoplasm of skin: Secondary | ICD-10-CM | POA: Diagnosis not present

## 2018-06-28 DIAGNOSIS — Z85828 Personal history of other malignant neoplasm of skin: Secondary | ICD-10-CM | POA: Diagnosis not present

## 2018-06-28 DIAGNOSIS — L821 Other seborrheic keratosis: Secondary | ICD-10-CM | POA: Diagnosis not present

## 2018-06-28 DIAGNOSIS — D225 Melanocytic nevi of trunk: Secondary | ICD-10-CM | POA: Diagnosis not present

## 2018-07-17 MED ORDER — DULOXETINE HCL 20 MG PO CPEP: 20.0000 mg | ORAL_CAPSULE | Freq: Every day | ORAL | 1 refills | Status: DC

## 2018-07-17 NOTE — Telephone Encounter (Signed)
Rx sent 

## 2018-08-02 DIAGNOSIS — H401131 Primary open-angle glaucoma, bilateral, mild stage: Secondary | ICD-10-CM | POA: Diagnosis not present

## 2018-08-16 ENCOUNTER — Encounter: Payer: Self-pay | Admitting: Internal Medicine

## 2018-08-16 ENCOUNTER — Other Ambulatory Visit: Payer: Self-pay

## 2018-08-16 ENCOUNTER — Ambulatory Visit (INDEPENDENT_AMBULATORY_CARE_PROVIDER_SITE_OTHER): Payer: Medicare Other | Admitting: Internal Medicine

## 2018-08-16 VITALS — BP 140/88 | HR 71 | Temp 97.9°F | Ht 65.0 in | Wt 111.0 lb

## 2018-08-16 DIAGNOSIS — E441 Mild protein-calorie malnutrition: Secondary | ICD-10-CM | POA: Diagnosis not present

## 2018-08-16 DIAGNOSIS — E039 Hypothyroidism, unspecified: Secondary | ICD-10-CM

## 2018-08-16 DIAGNOSIS — K224 Dyskinesia of esophagus: Secondary | ICD-10-CM

## 2018-08-16 DIAGNOSIS — F39 Unspecified mood [affective] disorder: Secondary | ICD-10-CM

## 2018-08-16 DIAGNOSIS — I471 Supraventricular tachycardia, unspecified: Secondary | ICD-10-CM | POA: Insufficient documentation

## 2018-08-16 LAB — COMPREHENSIVE METABOLIC PANEL
ALT: 24 U/L (ref 0–35)
AST: 28 U/L (ref 0–37)
Albumin: 4.8 g/dL (ref 3.5–5.2)
Alkaline Phosphatase: 70 U/L (ref 39–117)
BUN: 15 mg/dL (ref 6–23)
CO2: 32 mEq/L (ref 19–32)
Calcium: 9.7 mg/dL (ref 8.4–10.5)
Chloride: 101 mEq/L (ref 96–112)
Creatinine, Ser: 0.64 mg/dL (ref 0.40–1.20)
GFR: 88.89 mL/min (ref >60.00)
Glucose, Bld: 85 mg/dL (ref 70–99)
Potassium: 4.3 mEq/L (ref 3.5–5.1)
Sodium: 140 mEq/L (ref 135–145)
Total Bilirubin: 0.4 mg/dL (ref 0.2–1.2)
Total Protein: 7.1 g/dL (ref 6.0–8.3)

## 2018-08-16 LAB — CBC
HCT: 40.2 % (ref 36.0–46.0)
Hemoglobin: 13.5 g/dL (ref 12.0–15.0)
MCHC: 33.7 g/dL (ref 30.0–36.0)
MCV: 98.9 fl (ref 78.0–100.0)
Platelets: 193 10*3/uL (ref 150.0–400.0)
RBC: 4.07 Mil/uL (ref 3.87–5.11)
RDW: 12.9 % (ref 11.5–15.5)
WBC: 5.4 10*3/uL (ref 4.0–10.5)

## 2018-08-16 LAB — T4, FREE: Free T4: 0.92 ng/dL (ref 0.60–1.60)

## 2018-08-16 LAB — TSH: TSH: 1.06 u[IU]/mL (ref 0.35–4.50)

## 2018-08-16 MED ORDER — DULOXETINE HCL 20 MG PO CPEP: 20.0000 mg | ORAL_CAPSULE | Freq: Every day | ORAL | 3 refills | Status: DC

## 2018-08-16 NOTE — Progress Notes (Signed)
Subjective:    Patient ID: Robyn Washington, female    DOB: [birth year], 84 y.o.   MRN: [id number]  HPI Here for follow up of mood and GI issues Has generally tolerated the cymbalta Strangely--she can now bend her toes (which she couldn't do before) Arthralgia is also improved on it  Noticed an improvement in abdominal swelling on the cymbalta Has been eating more of a healthy diet--but this affects her reflux and esophageal issues prilosec really helped--but afraid of the side effects (mostly bone and concern for lupus). Between GI and rheum--they decided to keep her off this Using the pepcid but then has constipation--so working on that  Still feels mood issues Feels her mood is more even--not the same  (rare) peaks and valleys are better (had been the worst part). Mostly on "plateau" now The social isolation has been tough  Does have periods of feeling her heart "speed up" Like in the past Will occur with lifting things, occasionally with walking No chest pain Will get DOE if she has these spells Monitor did show brief SVT spells in 2018  Current Outpatient Medications on File Prior to Visit  Medication Sig Dispense Refill  . Calcium Carbonate-Vit D-Min (CALCIUM 600+D PLUS MINERALS) 600-400 MG-UNIT TABS Take by mouth.    . DULoxetine (CYMBALTA) 20 MG capsule Take 1 capsule (20 mg total) by mouth daily. 30 capsule 1  . famotidine (PEPCID) 20 MG tablet Take 1 tablet (20 mg total) by mouth 2 (two) times daily. 60 tablet 11  . fexofenadine (ALLEGRA) 180 MG tablet Take by mouth.    . Multiple Vitamins-Minerals (CENTRUM SILVER) tablet Take by mouth.    . SYNTHROID 75 MCG tablet Take 1 tablet (75 mcg total) by mouth daily. 90 tablet 3  . timolol (TIMOPTIC) 0.5 % ophthalmic solution     . tiZANidine (ZANAFLEX) 2 MG tablet Take 1 tablet (2 mg total) by mouth 3 (three) times daily as needed for muscle spasms. 30 tablet 0   No current facility-administered medications on file prior to  visit.     Allergies  Allergen Reactions  . Bioflavonoids Swelling    Swelling of lips Swelling of lips   . Banana   . Barley Grass Other (See Comments)    Other reaction(s): Other (See Comments)  . Pear Other (See Comments)    Other reaction(s): Other (See Comments) Feels bad   . Pineapple   . Rice Other (See Comments)    Rhinitis. Rhinitis.   . Rye Grass Flower Pollen Extract  [Gramineae Pollens] Other (See Comments)  . Wheat Bran Other (See Comments)  . Hydroxychloroquine Rash    Past Medical History:  Diagnosis Date  . Diverticulitis    hospitalized 2013  . Hypothyroidism   . IBS (irritable bowel syndrome)   . Lumbar degenerative disc disease   . Mood disorder ([place]) 2011   adjustment issues mostly to moving and husband's illness  . Osteopenia   . Raynaud's syndrome   . Rosacea     Past Surgical History:  Procedure Laterality Date  . MOHS SURGERY  2017   BSC?  Marland Kitchen PILONIDAL CYST EXCISION  1958  . TONSILLECTOMY AND ADENOIDECTOMY      Family History  Problem Relation Age of Onset  . Diabetes Mother   . Alcohol abuse Father   . Thyroid cancer Daughter     Social History   Socioeconomic History  . Marital status: Widowed    Spouse name: Not on file  .  Number of children: 2  . Years of education: Not on file  . Highest education level: Not on file  Occupational History  . Occupation: Web designer    Comment: [employer]  . Financial resource strain: Not on file  . Food insecurity    Worry: Not on file    Inability: Not on file  . Transportation needs    Medical: Not on file    Non-medical: Not on file  Tobacco Use  . Smoking status: Never Smoker  . Smokeless tobacco: Never Used  Substance and Sexual Activity  . Alcohol use: Never    Frequency: Never  . Drug use: Not on file  . Sexual activity: Not on file  Lifestyle  . Physical activity    Days per week: Not on file    Minutes per session: Not on file   . Stress: Not on file  Relationships  . Social Herbalist on phone: Not on file    Gets together: Not on file    Attends religious service: Not on file    Active member of club or organization: Not on file    Attends meetings of clubs or organizations: Not on file    Relationship status: Not on file  . Intimate partner violence    Fear of current or ex partner: Not on file    Emotionally abused: Not on file    Physically abused: Not on file    Forced sexual activity: Not on file  Other Topics Concern  . Not on file  Social History Narrative   2 daughters      Has living will   Daughter Benjamine Mola Warehouse manager) then Ivin Booty [facility]) have health care POA   Would accept resuscitation but no prolonged ventilation or tube feeds   Review of Systems Back pain is about the same Has gained some weight---relates this to ensure 1 can daily (or more)    Objective:   Physical Exam         Assessment & Plan:

## 2018-08-16 NOTE — Assessment & Plan Note (Signed)
Known short runs on monitor Ongoing but she feels it is not bad enough to warrant therapy If worsens, would start low dose metoprolol

## 2018-08-16 NOTE — Assessment & Plan Note (Signed)
Moved here, husband died, now COVID Some better on the duloxetine

## 2018-08-16 NOTE — Assessment & Plan Note (Signed)
Has gained back some weight with the ensure

## 2018-08-16 NOTE — Assessment & Plan Note (Signed)
Due for labs

## 2018-08-16 NOTE — Assessment & Plan Note (Signed)
Ongoing issues pepcid helps but not as much as PPI Could consider trying carafate

## 2018-10-19 DIAGNOSIS — Z23 Encounter for immunization: Secondary | ICD-10-CM | POA: Diagnosis not present

## 2018-10-28 ENCOUNTER — Telehealth: Payer: Self-pay | Admitting: Internal Medicine

## 2018-10-28 NOTE — Telephone Encounter (Signed)
She has regular Medicare. I will pass this to Dr Silvio Pate as I cannot remember if this can be done since she does not have a medicare advantage plan.

## 2018-10-28 NOTE — Telephone Encounter (Signed)
Just let her know that Medicare doesn't pay for a physical--but I will do a visit that will seem like a physical--but just bill for a follow up visit that will be covered

## 2018-10-28 NOTE — Telephone Encounter (Signed)
Pt aware of dr Kandice Moos comments

## 2018-10-28 NOTE — Telephone Encounter (Signed)
Best number [phone]  Pt called she has cpx medicare 12/23.  She doesn't want to do the medicare wellness part.  She stated she has done this before and its just a lot of questions.  Can she opt out of this but still dr Silvio Pate for cpx

## 2018-11-08 DIAGNOSIS — M5416 Radiculopathy, lumbar region: Secondary | ICD-10-CM | POA: Diagnosis not present

## 2018-11-08 DIAGNOSIS — Z8719 Personal history of other diseases of the digestive system: Secondary | ICD-10-CM | POA: Diagnosis not present

## 2018-11-08 DIAGNOSIS — R768 Other specified abnormal immunological findings in serum: Secondary | ICD-10-CM | POA: Diagnosis not present

## 2018-11-08 DIAGNOSIS — K588 Other irritable bowel syndrome: Secondary | ICD-10-CM | POA: Diagnosis not present

## 2018-11-08 DIAGNOSIS — Z85828 Personal history of other malignant neoplasm of skin: Secondary | ICD-10-CM | POA: Diagnosis not present

## 2018-11-08 DIAGNOSIS — M5136 Other intervertebral disc degeneration, lumbar region: Secondary | ICD-10-CM | POA: Diagnosis not present

## 2018-11-08 DIAGNOSIS — Z87898 Personal history of other specified conditions: Secondary | ICD-10-CM | POA: Diagnosis not present

## 2018-11-08 DIAGNOSIS — M150 Primary generalized (osteo)arthritis: Secondary | ICD-10-CM | POA: Diagnosis not present

## 2018-11-08 DIAGNOSIS — M25541 Pain in joints of right hand: Secondary | ICD-10-CM | POA: Diagnosis not present

## 2018-11-08 DIAGNOSIS — I7300 Raynaud's syndrome without gangrene: Secondary | ICD-10-CM | POA: Diagnosis not present

## 2018-11-08 DIAGNOSIS — R1319 Other dysphagia: Secondary | ICD-10-CM | POA: Diagnosis not present

## 2018-11-08 DIAGNOSIS — E039 Hypothyroidism, unspecified: Secondary | ICD-10-CM | POA: Diagnosis not present

## 2018-12-13 ENCOUNTER — Other Ambulatory Visit: Payer: Self-pay | Admitting: Internal Medicine

## 2019-01-12 ENCOUNTER — Telehealth: Payer: Self-pay | Admitting: Internal Medicine

## 2019-01-12 NOTE — Telephone Encounter (Signed)
error 

## 2019-01-16 MED ORDER — FLUCONAZOLE 100 MG PO TABS: 100.0000 mg | ORAL_TABLET | Freq: Every day | ORAL | 3 refills | Status: DC

## 2019-01-31 DIAGNOSIS — H401131 Primary open-angle glaucoma, bilateral, mild stage: Secondary | ICD-10-CM | POA: Diagnosis not present

## 2019-02-15 ENCOUNTER — Encounter: Payer: Self-pay | Admitting: Internal Medicine

## 2019-02-15 ENCOUNTER — Other Ambulatory Visit: Payer: Self-pay

## 2019-02-15 ENCOUNTER — Ambulatory Visit (INDEPENDENT_AMBULATORY_CARE_PROVIDER_SITE_OTHER): Payer: Medicare Other | Admitting: Internal Medicine

## 2019-02-15 VITALS — BP 118/70 | HR 65 | Temp 96.2°F | Ht 64.5 in | Wt 113.0 lb

## 2019-02-15 DIAGNOSIS — Z0000 Encounter for general adult medical examination without abnormal findings: Secondary | ICD-10-CM | POA: Diagnosis not present

## 2019-02-15 DIAGNOSIS — M5430 Sciatica, unspecified side: Secondary | ICD-10-CM

## 2019-02-15 DIAGNOSIS — I471 Supraventricular tachycardia, unspecified: Secondary | ICD-10-CM

## 2019-02-15 DIAGNOSIS — F39 Unspecified mood [affective] disorder: Secondary | ICD-10-CM

## 2019-02-15 DIAGNOSIS — B3781 Candidal esophagitis: Secondary | ICD-10-CM | POA: Diagnosis not present

## 2019-02-15 DIAGNOSIS — E039 Hypothyroidism, unspecified: Secondary | ICD-10-CM

## 2019-02-15 DIAGNOSIS — Z7189 Other specified counseling: Secondary | ICD-10-CM

## 2019-02-15 DIAGNOSIS — E441 Mild protein-calorie malnutrition: Secondary | ICD-10-CM | POA: Diagnosis not present

## 2019-02-15 LAB — COMPREHENSIVE METABOLIC PANEL
ALT: 25 U/L (ref 0–35)
AST: 32 U/L (ref 0–37)
Albumin: 4.8 g/dL (ref 3.5–5.2)
Alkaline Phosphatase: 80 U/L (ref 39–117)
BUN: 15 mg/dL (ref 6–23)
CO2: 29 mEq/L (ref 19–32)
Calcium: 10.2 mg/dL (ref 8.4–10.5)
Chloride: 100 mEq/L (ref 96–112)
Creatinine, Ser: 0.67 mg/dL (ref 0.40–1.20)
GFR: 84.21 mL/min (ref >60.00)
Glucose, Bld: 85 mg/dL (ref 70–99)
Potassium: 4.1 mEq/L (ref 3.5–5.1)
Sodium: 140 mEq/L (ref 135–145)
Total Bilirubin: 0.4 mg/dL (ref 0.2–1.2)
Total Protein: 7.5 g/dL (ref 6.0–8.3)

## 2019-02-15 LAB — CBC
HCT: 40.6 % (ref 36.0–46.0)
Hemoglobin: 13.6 g/dL (ref 12.0–15.0)
MCHC: 33.6 g/dL (ref 30.0–36.0)
MCV: 98.9 fl (ref 78.0–100.0)
Platelets: 204 10*3/uL (ref 150.0–400.0)
RBC: 4.1 Mil/uL (ref 3.87–5.11)
RDW: 12.5 % (ref 11.5–15.5)
WBC: 6.4 10*3/uL (ref 4.0–10.5)

## 2019-02-15 LAB — TSH: TSH: 2.06 u[IU]/mL (ref 0.35–4.50)

## 2019-02-15 LAB — T4, FREE: Free T4: 0.91 ng/dL (ref 0.60–1.60)

## 2019-02-15 MED ORDER — DULOXETINE HCL 30 MG PO CPEP: 30.0000 mg | ORAL_CAPSULE | Freq: Every day | ORAL | 3 refills | Status: DC

## 2019-02-15 NOTE — Assessment & Plan Note (Signed)
Will increase the duloxetine Physiatry referral

## 2019-02-15 NOTE — Progress Notes (Signed)
Subjective:    Patient ID: Robyn Washington, female    DOB: 04/06/1934, 84 y.o.   MRN: [id number]  HPI Here for Medicare wellness visit and follow up of chronic health conditions  This visit occurred during the SARS-CoV-2 public health emergency.  Safety protocols were in place, including screening questions prior to the visit, additional usage of staff PPE, and extensive cleaning of exam room while observing appropriate contact time as indicated for disinfecting solutions.   Reviewed form and advanced directives Reviewed other doctors No alcohol or tobacco Exercises regularly Vision and hearing seem to be okay. Is on drops for glaucoma Chronic mood issues No falls Independent with instrumental ADLs No memory problems  Ongoing problems with her back Will feel "ripples of muscle spasm" across her lower back related to this Long standing problems--especially when caring/lifting infirm husband  Hasn't gone to chiropractor Wonders about physiatry Duloxetine has helped the sciatica symptoms  Duloxetine also helped arthralgias   Still cries a lot Doesn't feel she is depressed all the time--still grieving (lost husband and dog) Some anhedonia---relates to COVID, etc "I feel I have lost my independence"  Still with intermittent trouble swallowing/nausea This is better if she takes the fluconazole 100mg  daily for a week (last time recently) Wonders if the candida comes back---about once a year Using the famotidine now--off the omeprazole  Appetite still spotty Weight up slightly--she forces herself and eats a lot of ice cream  Does feel heart racing at times Relates to when her back hurts Does get out of breath walking--relates to the pain mostly (actually finds herself holding her breath) No chest pain No dizziness or syncope  Current Outpatient Medications on File Prior to Visit  Medication Sig Dispense Refill  . Calcium Carbonate-Vit D-Min (CALCIUM 600+D PLUS MINERALS)  600-400 MG-UNIT TABS Take by mouth.    . fexofenadine (ALLEGRA) 180 MG tablet Take by mouth.    . Multiple Vitamins-Minerals (CENTRUM SILVER) tablet Take by mouth.    . SYNTHROID 75 MCG tablet TAKE 1 TABLET BY MOUTH  DAILY 90 tablet 3  . timolol (TIMOPTIC) 0.5 % ophthalmic solution     . tiZANidine (ZANAFLEX) 2 MG tablet Take 1 tablet (2 mg total) by mouth 3 (three) times daily as needed for muscle spasms. 30 tablet 0  . [DISCONTINUED] DULoxetine (CYMBALTA) 20 MG capsule Take 1 capsule (20 mg total) by mouth daily. 90 capsule 3  . famotidine (PEPCID) 20 MG tablet Take 1 tablet (20 mg total) by mouth 2 (two) times daily. (Patient not taking: Reported on 02/15/2019) 60 tablet 11   No current facility-administered medications on file prior to visit.    Allergies  Allergen Reactions  . Bioflavonoids Swelling    Swelling of lips Swelling of lips   . Banana   . Barley Grass Other (See Comments)    Other reaction(s): Other (See Comments)  . Pear Other (See Comments)    Other reaction(s): Other (See Comments) Feels bad   . Pineapple   . Rice Other (See Comments)    Rhinitis. Rhinitis.   . Rye Grass Flower Pollen Extract  [Gramineae Pollens] Other (See Comments)  . Wheat Bran Other (See Comments)  . Hydroxychloroquine Rash    Past Medical History:  Diagnosis Date  . Diverticulitis    hospitalized 2013  . Hypothyroidism   . IBS (irritable bowel syndrome)   . Lumbar degenerative disc disease   . Mood disorder ([place]) 2011   adjustment issues mostly to moving and husband's  illness  . Osteopenia   . Raynaud's syndrome   . Rosacea     Past Surgical History:  Procedure Laterality Date  . MOHS SURGERY  2017   BSC?  Marland Kitchen PILONIDAL CYST EXCISION  1958  . TONSILLECTOMY AND ADENOIDECTOMY      Family History  Problem Relation Age of Onset  . Diabetes Mother   . Alcohol abuse Father   . Thyroid cancer Daughter     Social History   Socioeconomic History  . Marital status:  Widowed    Spouse name: Not on file  . Number of children: 2  . Years of education: Not on file  . Highest education level: Not on file  Occupational History  . Occupation: Web designer    Comment: Manufacturing systems engineer  Tobacco Use  . Smoking status: Never Smoker  . Smokeless tobacco: Never Used  Substance and Sexual Activity  . Alcohol use: Never  . Drug use: Not on file  . Sexual activity: Not on file  Other Topics Concern  . Not on file  Social History Narrative   2 daughters      Has living will   Daughter Benjamine Mola Warehouse manager) then Ivin Booty [employer]) have health care POA   Would accept resuscitation but no prolonged ventilation or tube feeds   Social Determinants of Health   Financial Resource Strain:   . Difficulty of Paying Living Expenses: Not on file  Food Insecurity:   . Worried About Charity fundraiser in the Last Year: Not on file  . Ran Out of Food in the Last Year: Not on file  Transportation Needs:   . Lack of Transportation (Medical): Not on file  . Lack of Transportation (Non-Medical): Not on file  Physical Activity:   . Days of Exercise per Week: Not on file  . Minutes of Exercise per Session: Not on file  Stress:   . Feeling of Stress : Not on file  Social Connections:   . Frequency of Communication with Friends and Family: Not on file  . Frequency of Social Gatherings with Friends and Family: Not on file  . Attends Religious Services: Not on file  . Active Member of Clubs or Organizations: Not on file  . Attends Archivist Meetings: Not on file  . Marital Status: Not on file  Intimate Partner Violence:   . Fear of Current or Ex-Partner: Not on file  . Emotionally Abused: Not on file  . Physically Abused: Not on file  . Sexually Abused: Not on file   Review of Systems Sleeps fair--but has to be careful about nighttime activities Wears seat belt Teeth okay--keeps up with dentist No suspicious skin lesions--did get hives  after trying bananas again Constant heartburn--famotidine helps but constipates her. Did restart omeprazole briefly Bloats every time after she eats Some constipation--miralax helps Voids okay--no incontinence    Objective:   Physical Exam  Constitutional: She is oriented to person, place, and time. No distress.  Still with very little muscle mass  HENT:  Mouth/Throat: Oropharynx is clear and moist. No oropharyngeal exudate.  Neck: No thyromegaly present.  Cardiovascular: Normal rate, regular rhythm, normal heart sounds and intact distal pulses. Exam reveals no gallop.  No murmur heard. Faint pedal pulses  Respiratory: Effort normal and breath sounds normal. No respiratory distress. She has no wheezes. She has no rales.  GI: Soft. She exhibits no distension. There is no rebound and no guarding.  Mild generalized tenderness  Musculoskeletal:  General: No tenderness or edema.  Lymphadenopathy:    She has no cervical adenopathy.  Neurological: She is alert and oriented to person, place, and time.  President-- "Trump, President elect Judeth Horn" [id number] Recall 3/3  Skin: No rash noted. No erythema.  Psychiatric: She has a normal mood and affect. Her behavior is normal.           Assessment & Plan:

## 2019-02-15 NOTE — Assessment & Plan Note (Signed)
Ongoing grieving, dysthymia Some better with duloxetine--will try increasing dose (also for back and abdominal symptoms)

## 2019-02-15 NOTE — Assessment & Plan Note (Signed)
Seems to get episodic recurrence of symptoms 1 week of fluconazole was effective recently

## 2019-02-15 NOTE — Assessment & Plan Note (Signed)
Has gained some weight but still can't eat much Hopefully increased duloxetine may help abdominal symptoms

## 2019-02-15 NOTE — Assessment & Plan Note (Signed)
See social history 

## 2019-02-15 NOTE — Assessment & Plan Note (Signed)
I have personally reviewed the Medicare Annual Wellness questionnaire and have noted 1. The patient's medical and social history 2. Their use of alcohol, tobacco or illicit drugs 3. Their current medications and supplements 4. The patient's functional ability including ADL's, fall risks, home safety risks and hearing or visual             impairment. 5. Diet and physical activities 6. Evidence for depression or mood disorders  The patients weight, height, BMI and visual acuity have been recorded in the chart I have made referrals, counseling and provided education to the patient based review of the above and I have provided the pt with a written personalized care plan for preventive services.  I have provided you with a copy of your personalized plan for preventive services. Please take the time to review along with your updated medication list.  Trying to stay fit Discussed getting shingrix at pharmacy Td if any injury No cancer screening due to age

## 2019-02-15 NOTE — Assessment & Plan Note (Signed)
Still seems euthyroid Will recheck labs

## 2019-02-15 NOTE — Progress Notes (Signed)
Hearing Screening   Method: Audiometry   125Hz  250Hz  500Hz  1000Hz  2000Hz  3000Hz  4000Hz  6000Hz  8000Hz   Right ear:   20 20 20   0    Left ear:   20 20 20   0    Vision Screening Comments: December 2020

## 2019-02-15 NOTE — Assessment & Plan Note (Signed)
Feels her heart fast at times--but not clearly recurrence No action for now

## 2019-03-07 DIAGNOSIS — M545 Low back pain: Secondary | ICD-10-CM | POA: Diagnosis not present

## 2019-03-07 DIAGNOSIS — M62830 Muscle spasm of back: Secondary | ICD-10-CM | POA: Diagnosis not present

## 2019-03-07 DIAGNOSIS — M5441 Lumbago with sciatica, right side: Secondary | ICD-10-CM | POA: Diagnosis not present

## 2019-03-07 DIAGNOSIS — G8929 Other chronic pain: Secondary | ICD-10-CM | POA: Diagnosis not present

## 2019-03-07 DIAGNOSIS — M5136 Other intervertebral disc degeneration, lumbar region: Secondary | ICD-10-CM | POA: Diagnosis not present

## 2019-03-07 DIAGNOSIS — M5442 Lumbago with sciatica, left side: Secondary | ICD-10-CM | POA: Diagnosis not present

## 2019-03-08 ENCOUNTER — Other Ambulatory Visit: Payer: Self-pay | Admitting: Physical Medicine and Rehabilitation

## 2019-03-08 DIAGNOSIS — M5416 Radiculopathy, lumbar region: Secondary | ICD-10-CM

## 2019-03-08 DIAGNOSIS — K560 Paralytic ileus: Secondary | ICD-10-CM

## 2019-03-09 DIAGNOSIS — Z23 Encounter for immunization: Secondary | ICD-10-CM | POA: Diagnosis not present

## 2019-03-14 DIAGNOSIS — R1013 Epigastric pain: Secondary | ICD-10-CM | POA: Diagnosis not present

## 2019-03-14 DIAGNOSIS — K224 Dyskinesia of esophagus: Secondary | ICD-10-CM | POA: Diagnosis not present

## 2019-03-14 DIAGNOSIS — K567 Ileus, unspecified: Secondary | ICD-10-CM | POA: Diagnosis not present

## 2019-03-14 DIAGNOSIS — Z8659 Personal history of other mental and behavioral disorders: Secondary | ICD-10-CM | POA: Diagnosis not present

## 2019-03-14 DIAGNOSIS — R140 Abdominal distension (gaseous): Secondary | ICD-10-CM | POA: Diagnosis not present

## 2019-03-14 DIAGNOSIS — K581 Irritable bowel syndrome with constipation: Secondary | ICD-10-CM | POA: Diagnosis not present

## 2019-03-14 DIAGNOSIS — R110 Nausea: Secondary | ICD-10-CM | POA: Diagnosis not present

## 2019-03-14 DIAGNOSIS — K5900 Constipation, unspecified: Secondary | ICD-10-CM | POA: Diagnosis not present

## 2019-03-15 DIAGNOSIS — G8929 Other chronic pain: Secondary | ICD-10-CM | POA: Diagnosis not present

## 2019-03-15 DIAGNOSIS — M5441 Lumbago with sciatica, right side: Secondary | ICD-10-CM | POA: Diagnosis not present

## 2019-03-15 DIAGNOSIS — M5442 Lumbago with sciatica, left side: Secondary | ICD-10-CM | POA: Diagnosis not present

## 2019-03-17 ENCOUNTER — Ambulatory Visit: Admission: RE | Admit: 2019-03-17 | Payer: Medicare Other | Source: Ambulatory Visit

## 2019-03-19 ENCOUNTER — Ambulatory Visit
Admission: RE | Admit: 2019-03-19 | Discharge: 2019-03-19 | Disposition: A | Discharge status: Home / Self Care | Payer: Medicare Other | Source: Ambulatory Visit | Attending: Physical Medicine and Rehabilitation | Admitting: Physical Medicine and Rehabilitation

## 2019-03-19 ENCOUNTER — Other Ambulatory Visit: Payer: Self-pay

## 2019-03-19 DIAGNOSIS — M5416 Radiculopathy, lumbar region: Secondary | ICD-10-CM | POA: Insufficient documentation

## 2019-03-19 DIAGNOSIS — M5126 Other intervertebral disc displacement, lumbar region: Secondary | ICD-10-CM | POA: Diagnosis not present

## 2019-03-21 DIAGNOSIS — M5441 Lumbago with sciatica, right side: Secondary | ICD-10-CM | POA: Diagnosis not present

## 2019-03-21 DIAGNOSIS — M5442 Lumbago with sciatica, left side: Secondary | ICD-10-CM | POA: Diagnosis not present

## 2019-03-21 DIAGNOSIS — G8929 Other chronic pain: Secondary | ICD-10-CM | POA: Diagnosis not present

## 2019-03-23 DIAGNOSIS — G8929 Other chronic pain: Secondary | ICD-10-CM | POA: Diagnosis not present

## 2019-03-23 DIAGNOSIS — M5441 Lumbago with sciatica, right side: Secondary | ICD-10-CM | POA: Diagnosis not present

## 2019-03-23 DIAGNOSIS — M5442 Lumbago with sciatica, left side: Secondary | ICD-10-CM | POA: Diagnosis not present

## 2019-03-28 DIAGNOSIS — G8929 Other chronic pain: Secondary | ICD-10-CM | POA: Diagnosis not present

## 2019-03-28 DIAGNOSIS — M5441 Lumbago with sciatica, right side: Secondary | ICD-10-CM | POA: Diagnosis not present

## 2019-03-28 DIAGNOSIS — M5442 Lumbago with sciatica, left side: Secondary | ICD-10-CM | POA: Diagnosis not present

## 2019-03-30 DIAGNOSIS — M5442 Lumbago with sciatica, left side: Secondary | ICD-10-CM | POA: Diagnosis not present

## 2019-03-30 DIAGNOSIS — M5441 Lumbago with sciatica, right side: Secondary | ICD-10-CM | POA: Diagnosis not present

## 2019-03-30 DIAGNOSIS — G8929 Other chronic pain: Secondary | ICD-10-CM | POA: Diagnosis not present

## 2019-04-03 DIAGNOSIS — M5441 Lumbago with sciatica, right side: Secondary | ICD-10-CM | POA: Diagnosis not present

## 2019-04-03 DIAGNOSIS — G8929 Other chronic pain: Secondary | ICD-10-CM | POA: Diagnosis not present

## 2019-04-03 DIAGNOSIS — M5442 Lumbago with sciatica, left side: Secondary | ICD-10-CM | POA: Diagnosis not present

## 2019-04-05 DIAGNOSIS — G8929 Other chronic pain: Secondary | ICD-10-CM | POA: Diagnosis not present

## 2019-04-05 DIAGNOSIS — M5442 Lumbago with sciatica, left side: Secondary | ICD-10-CM | POA: Diagnosis not present

## 2019-04-05 DIAGNOSIS — M5441 Lumbago with sciatica, right side: Secondary | ICD-10-CM | POA: Diagnosis not present

## 2019-04-06 DIAGNOSIS — Z23 Encounter for immunization: Secondary | ICD-10-CM | POA: Diagnosis not present

## 2019-04-11 DIAGNOSIS — M5442 Lumbago with sciatica, left side: Secondary | ICD-10-CM | POA: Diagnosis not present

## 2019-04-11 DIAGNOSIS — M5441 Lumbago with sciatica, right side: Secondary | ICD-10-CM | POA: Diagnosis not present

## 2019-04-11 DIAGNOSIS — G8929 Other chronic pain: Secondary | ICD-10-CM | POA: Diagnosis not present

## 2019-04-14 DIAGNOSIS — M5441 Lumbago with sciatica, right side: Secondary | ICD-10-CM | POA: Diagnosis not present

## 2019-04-14 DIAGNOSIS — M5442 Lumbago with sciatica, left side: Secondary | ICD-10-CM | POA: Diagnosis not present

## 2019-04-14 DIAGNOSIS — G8929 Other chronic pain: Secondary | ICD-10-CM | POA: Diagnosis not present

## 2019-04-17 DIAGNOSIS — K581 Irritable bowel syndrome with constipation: Secondary | ICD-10-CM | POA: Diagnosis not present

## 2019-04-17 DIAGNOSIS — Z8659 Personal history of other mental and behavioral disorders: Secondary | ICD-10-CM | POA: Diagnosis not present

## 2019-04-17 DIAGNOSIS — K224 Dyskinesia of esophagus: Secondary | ICD-10-CM | POA: Diagnosis not present

## 2019-04-17 DIAGNOSIS — R1013 Epigastric pain: Secondary | ICD-10-CM | POA: Diagnosis not present

## 2019-04-17 DIAGNOSIS — R140 Abdominal distension (gaseous): Secondary | ICD-10-CM | POA: Diagnosis not present

## 2019-04-18 DIAGNOSIS — M62830 Muscle spasm of back: Secondary | ICD-10-CM | POA: Diagnosis not present

## 2019-04-18 DIAGNOSIS — M5136 Other intervertebral disc degeneration, lumbar region: Secondary | ICD-10-CM | POA: Diagnosis not present

## 2019-04-18 DIAGNOSIS — M5441 Lumbago with sciatica, right side: Secondary | ICD-10-CM | POA: Diagnosis not present

## 2019-04-18 DIAGNOSIS — G8929 Other chronic pain: Secondary | ICD-10-CM | POA: Diagnosis not present

## 2019-04-18 DIAGNOSIS — M5442 Lumbago with sciatica, left side: Secondary | ICD-10-CM | POA: Diagnosis not present

## 2019-04-19 DIAGNOSIS — G8929 Other chronic pain: Secondary | ICD-10-CM | POA: Diagnosis not present

## 2019-04-19 DIAGNOSIS — M5441 Lumbago with sciatica, right side: Secondary | ICD-10-CM | POA: Diagnosis not present

## 2019-04-19 DIAGNOSIS — M5442 Lumbago with sciatica, left side: Secondary | ICD-10-CM | POA: Diagnosis not present

## 2019-04-21 DIAGNOSIS — G8929 Other chronic pain: Secondary | ICD-10-CM | POA: Diagnosis not present

## 2019-04-21 DIAGNOSIS — M5441 Lumbago with sciatica, right side: Secondary | ICD-10-CM | POA: Diagnosis not present

## 2019-04-21 DIAGNOSIS — M5442 Lumbago with sciatica, left side: Secondary | ICD-10-CM | POA: Diagnosis not present

## 2019-04-24 DIAGNOSIS — M5136 Other intervertebral disc degeneration, lumbar region: Secondary | ICD-10-CM | POA: Diagnosis not present

## 2019-04-24 DIAGNOSIS — M62830 Muscle spasm of back: Secondary | ICD-10-CM | POA: Diagnosis not present

## 2019-04-24 DIAGNOSIS — M5416 Radiculopathy, lumbar region: Secondary | ICD-10-CM | POA: Diagnosis not present

## 2019-05-02 DIAGNOSIS — Z8679 Personal history of other diseases of the circulatory system: Secondary | ICD-10-CM | POA: Diagnosis not present

## 2019-05-02 DIAGNOSIS — I351 Nonrheumatic aortic (valve) insufficiency: Secondary | ICD-10-CM | POA: Diagnosis not present

## 2019-05-02 DIAGNOSIS — I872 Venous insufficiency (chronic) (peripheral): Secondary | ICD-10-CM | POA: Diagnosis not present

## 2019-05-02 DIAGNOSIS — I7300 Raynaud's syndrome without gangrene: Secondary | ICD-10-CM | POA: Diagnosis not present

## 2019-05-03 DIAGNOSIS — G8929 Other chronic pain: Secondary | ICD-10-CM | POA: Diagnosis not present

## 2019-05-03 DIAGNOSIS — M5441 Lumbago with sciatica, right side: Secondary | ICD-10-CM | POA: Diagnosis not present

## 2019-05-03 DIAGNOSIS — M5442 Lumbago with sciatica, left side: Secondary | ICD-10-CM | POA: Diagnosis not present

## 2019-05-10 DIAGNOSIS — M5442 Lumbago with sciatica, left side: Secondary | ICD-10-CM | POA: Diagnosis not present

## 2019-05-10 DIAGNOSIS — G8929 Other chronic pain: Secondary | ICD-10-CM | POA: Diagnosis not present

## 2019-05-10 DIAGNOSIS — M5441 Lumbago with sciatica, right side: Secondary | ICD-10-CM | POA: Diagnosis not present

## 2019-05-16 DIAGNOSIS — M5441 Lumbago with sciatica, right side: Secondary | ICD-10-CM | POA: Diagnosis not present

## 2019-05-16 DIAGNOSIS — G8929 Other chronic pain: Secondary | ICD-10-CM | POA: Diagnosis not present

## 2019-05-16 DIAGNOSIS — M5442 Lumbago with sciatica, left side: Secondary | ICD-10-CM | POA: Diagnosis not present

## 2019-05-18 DIAGNOSIS — G8929 Other chronic pain: Secondary | ICD-10-CM | POA: Diagnosis not present

## 2019-05-18 DIAGNOSIS — M5441 Lumbago with sciatica, right side: Secondary | ICD-10-CM | POA: Diagnosis not present

## 2019-05-18 DIAGNOSIS — M5442 Lumbago with sciatica, left side: Secondary | ICD-10-CM | POA: Diagnosis not present

## 2019-07-03 ENCOUNTER — Encounter: Payer: Self-pay | Admitting: Dermatology

## 2019-07-03 ENCOUNTER — Other Ambulatory Visit: Payer: Self-pay

## 2019-07-03 ENCOUNTER — Ambulatory Visit (INDEPENDENT_AMBULATORY_CARE_PROVIDER_SITE_OTHER): Payer: Medicare Other | Admitting: Dermatology

## 2019-07-03 DIAGNOSIS — D1800 Hemangioma unspecified site: Secondary | ICD-10-CM

## 2019-07-03 DIAGNOSIS — L821 Other seborrheic keratosis: Secondary | ICD-10-CM

## 2019-07-03 DIAGNOSIS — D229 Melanocytic nevi, unspecified: Secondary | ICD-10-CM

## 2019-07-03 DIAGNOSIS — L719 Rosacea, unspecified: Secondary | ICD-10-CM | POA: Diagnosis not present

## 2019-07-03 DIAGNOSIS — D692 Other nonthrombocytopenic purpura: Secondary | ICD-10-CM | POA: Diagnosis not present

## 2019-07-03 DIAGNOSIS — Z85828 Personal history of other malignant neoplasm of skin: Secondary | ICD-10-CM | POA: Diagnosis not present

## 2019-07-03 DIAGNOSIS — L578 Other skin changes due to chronic exposure to nonionizing radiation: Secondary | ICD-10-CM

## 2019-07-03 DIAGNOSIS — R21 Rash and other nonspecific skin eruption: Secondary | ICD-10-CM | POA: Diagnosis not present

## 2019-07-03 DIAGNOSIS — L738 Other specified follicular disorders: Secondary | ICD-10-CM | POA: Diagnosis not present

## 2019-07-03 DIAGNOSIS — Z1283 Encounter for screening for malignant neoplasm of skin: Secondary | ICD-10-CM | POA: Diagnosis not present

## 2019-07-03 MED ORDER — TRIAMCINOLONE ACETONIDE 0.1 % EX CREA: TOPICAL_CREAM | CUTANEOUS | 0 refills | Status: DC

## 2019-07-03 MED ORDER — DOXYCYCLINE MONOHYDRATE 100 MG PO TABS: 100.0000 mg | ORAL_TABLET | Freq: Every day | ORAL | 2 refills | Status: DC

## 2019-07-03 NOTE — Progress Notes (Signed)
Follow-Up Visit   Subjective  Robyn Washington is a 85 y.o. female who presents for the following: Annual Exam. Patient has a history of a rash on shoulders and back, off and on for years, possibly food related. She has had allergy testing in the past. She uses triamcinolone 0.1% cream and fexofenadine as needed for itch.   Patient also uses doxycycline as needed for Rosacea.  The following portions of the chart were reviewed this encounter and updated as appropriate:      Review of Systems:  No other skin or systemic complaints except as noted in HPI or Assessment and Plan.  Objective  Well appearing patient in no apparent distress; mood and affect are within normal limits.  A full examination was performed including scalp, head, eyes, ears, nose, lips, neck, chest, axillae, abdomen, back, buttocks, bilateral upper extremities, bilateral lower extremities, hands, feet, fingers, toes, fingernails, and toenails. All findings within normal limits unless otherwise noted below.  Objective  Right Forehead: Well healed scar with no evidence of recurrence.   Objective  Back, shoulders, right hip: Healing excoriations on back, right hip, shoulders.  No primary lesions present.  Pt thinks they start out as hives, and come up after eating certain foods like bread.  Pt says she is allergic, but doesn't want to stop eating bread.  Objective  Face: Mild erythema on nose and cheeks.  Objective  Left Temple, Left paranasal: Firm flesh yellow papules  Left temple 3.62mm   Assessment & Plan   Skin cancer screening performed today.  Actinic Damage - diffuse scaly erythematous macules with underlying dyspigmentation - Recommend daily broad spectrum sunscreen SPF 30+ to sun-exposed areas, reapply every 2 hours as needed.  - Call for new or changing lesions.  Seborrheic Keratoses - Stuck-on, waxy, tan-brown papules and plaques  - Discussed benign etiology and prognosis. - Observe -  Call for any changes      Melanocytic Nevi - Tan-brown and/or pink-flesh-colored symmetric macules and papules - Benign appearing on exam today - Observation - Call clinic for new or changing moles - Recommend daily use of broad spectrum spf 30+ sunscreen to sun-exposed areas.  Hemangiomas - Red papules - Discussed benign nature - Observe - Call for any changes  Purpura - Violaceous macules and patches - Benign - Related to age, sun damage and/or use of blood thinners - Observe - Can use OTC arnica containing moisturizer such as Dermend Bruise Formula if desired - Call for worsening or other concerns    History of basal cell carcinoma (BCC) Right Forehead  Clear. Observe for recurrence. Call clinic for new or changing lesions.  Recommend regular skin exams, daily broad-spectrum spf 30+ sunscreen use, and photoprotection.     Rash Back, shoulders, right hip  Urticaria vrs Dermatitis Continue Allegra 180mg  or Xyzal 5mg  tablet with breakfast. Continue TMC 0.1% Cream QD/BID prn. [facility] Discussed CeraVe/Clobetsol mix instead of TMC, but pt defers since [facility] seems to work pretty well.  Recommend mild soap and moisturizing cream 1-2 times daily.  Samples of Aveeno Balm, Aveeno Eczema, Aquaphor, CeraVe Itch Relief, CeraVe SA, Dove Bodywash.  triamcinolone cream (KENALOG) 0.1 % - Back, shoulders, right hip  Rosacea Face  Continue Azelaic Acid/Metronidazole/Ivermectin Cream 15%/1%/1% QHS.  Pt has.  [facility] [phone]. Patient will call for refills.  Continue SPF 30+ daily to face.  Continue Doxycycline take 1 po QD prn flares #90 [facility]  Doxycycline should be taken with food to prevent nausea. Do  not lay down for 30 minutes after taking. Be cautious with sun exposure and use good sun protection while on this medication. Pregnant women should not take this medication.    doxycycline (ADOXA) 100 MG tablet - Face  Sebaceous hyperplasia Left Temple, Left  paranasal  Benign, observe.    Return in about 1 year (around 07/02/2020) for TBSE.  IJamesetta Orleans, CMA, am acting as scribe for Robyn Patty, MD .  Documentation: I have reviewed the above documentation for accuracy and completeness, and I agree with the above.  Robyn Patty MD

## 2019-07-03 NOTE — Patient Instructions (Addendum)
Continue Allegra 180mg  OR Xyzal 5mg  tablet with breakfast. Continue triamcinolone cream 1-2 times daily as needed for itch.   Dry Skin Care  What causes dry skin?  Dry skin is common and results from inadequate moisture in the outer skin layers. Dry skin usually results from the excessive loss of moisture from the skin surface. This occurs due to two major factors: 1. Normally the skin's oil glands deposit a layer of oil on the skin's surface. This layer of oil prevents the loss of moisture from the skin. Exposure to soaps, cleaners, solvents, and disinfectants removes this oily film, allowing water to escape. 2. Water loss from the skin increases when the humidity is low. During winter months we spend a lot of time indoors where the air is heated. Heated air has very low humidity. This also contributes to dry skin.  A tendency for dry skin may accompany such disorders as eczema. Also, as people age, the number of functioning oil glands decreases, and the tendency toward dry skin can be a sensation of skin tightness when emerging from the shower.  How do I manage dry skin?  1. Humidify your environment. This can be accomplished by using a humidifier in your bedroom at night during winter months. 2. Bathing can actually put moisture back into your skin if done right. Take the following steps while bathing to sooth dry skin:  Avoid hot water, which only dries the skin and makes itching worse. Use warm water.  Avoid washcloths or extensive rubbing or scrubbing.  Use mild soaps like unscented Dove, Oil of Olay, Cetaphil, Basis, or CeraVe.  If you take baths rather than showers, rinse off soap residue with clean water before getting out of tub.  Once out of the shower/tub, pat dry gently with a soft towel. Leave your skin damp.  While still damp, apply any medicated ointment/cream you were prescribed to the affected areas. After you apply your medicated ointment/cream, then apply your  moisturizer to your whole body.This is the most important step in dry skin care. If this is omitted, your skin will continue to be dry.  The choice of moisturizer is also very important. In general, lotion will not provider enough moisture to severely dry skin because it is water based. You should use an ointment or cream. Moisturizers should also be unscented. Good choices include Vaseline (plain petrolatum), Aquaphor, Cetaphil, CeraVe, Vanicream, DML Forte, Aveeno moisture, or Eucerin Cream.  Bath oils can be helpful, but do not replace the application of moisturizer after the bath. In addition, they make the tub slippery causing an increased risk for falls. Therefore, we do not recommend their use.

## 2019-07-14 DIAGNOSIS — Z8659 Personal history of other mental and behavioral disorders: Secondary | ICD-10-CM | POA: Diagnosis not present

## 2019-07-14 DIAGNOSIS — R140 Abdominal distension (gaseous): Secondary | ICD-10-CM | POA: Diagnosis not present

## 2019-07-14 DIAGNOSIS — K224 Dyskinesia of esophagus: Secondary | ICD-10-CM | POA: Diagnosis not present

## 2019-07-14 DIAGNOSIS — K581 Irritable bowel syndrome with constipation: Secondary | ICD-10-CM | POA: Diagnosis not present

## 2019-07-14 DIAGNOSIS — R1013 Epigastric pain: Secondary | ICD-10-CM | POA: Diagnosis not present

## 2019-07-17 DIAGNOSIS — H401131 Primary open-angle glaucoma, bilateral, mild stage: Secondary | ICD-10-CM | POA: Diagnosis not present

## 2019-07-25 DIAGNOSIS — M5416 Radiculopathy, lumbar region: Secondary | ICD-10-CM | POA: Diagnosis not present

## 2019-07-25 DIAGNOSIS — M62830 Muscle spasm of back: Secondary | ICD-10-CM | POA: Diagnosis not present

## 2019-07-25 DIAGNOSIS — M5136 Other intervertebral disc degeneration, lumbar region: Secondary | ICD-10-CM | POA: Diagnosis not present

## 2019-07-25 MED ORDER — LEVOTHYROXINE SODIUM 75 MCG PO TABS: 75.0000 ug | ORAL_TABLET | Freq: Every day | ORAL | 3 refills | Status: DC

## 2019-07-26 ENCOUNTER — Telehealth: Payer: Self-pay | Admitting: *Deleted

## 2019-07-26 NOTE — Telephone Encounter (Signed)
A female from [facility] left a voicemail stating that they received a script for levothyroxine. The message left was that patient has been on Synthroid and [place] law requires a verification from the physician that it is okay to switch to the generic. [facility] requested a call back to verify that this is okay Ref # [id number]

## 2019-07-27 NOTE — Telephone Encounter (Signed)
Yes---we decided to switch to the generic

## 2019-07-27 NOTE — Telephone Encounter (Signed)
Spoke to [employer]. Authorized generic levothyroxine.

## 2019-07-31 DIAGNOSIS — H401131 Primary open-angle glaucoma, bilateral, mild stage: Secondary | ICD-10-CM | POA: Diagnosis not present

## 2019-08-22 DIAGNOSIS — M9903 Segmental and somatic dysfunction of lumbar region: Secondary | ICD-10-CM | POA: Diagnosis not present

## 2019-08-22 DIAGNOSIS — M461 Sacroiliitis, not elsewhere classified: Secondary | ICD-10-CM | POA: Diagnosis not present

## 2019-08-22 DIAGNOSIS — M9904 Segmental and somatic dysfunction of sacral region: Secondary | ICD-10-CM | POA: Diagnosis not present

## 2019-08-22 DIAGNOSIS — M5442 Lumbago with sciatica, left side: Secondary | ICD-10-CM | POA: Diagnosis not present

## 2019-08-29 DIAGNOSIS — M9904 Segmental and somatic dysfunction of sacral region: Secondary | ICD-10-CM | POA: Diagnosis not present

## 2019-08-29 DIAGNOSIS — M9903 Segmental and somatic dysfunction of lumbar region: Secondary | ICD-10-CM | POA: Diagnosis not present

## 2019-08-29 DIAGNOSIS — M461 Sacroiliitis, not elsewhere classified: Secondary | ICD-10-CM | POA: Diagnosis not present

## 2019-08-29 DIAGNOSIS — M5442 Lumbago with sciatica, left side: Secondary | ICD-10-CM | POA: Diagnosis not present

## 2019-08-31 DIAGNOSIS — M9904 Segmental and somatic dysfunction of sacral region: Secondary | ICD-10-CM | POA: Diagnosis not present

## 2019-08-31 DIAGNOSIS — M461 Sacroiliitis, not elsewhere classified: Secondary | ICD-10-CM | POA: Diagnosis not present

## 2019-08-31 DIAGNOSIS — M9903 Segmental and somatic dysfunction of lumbar region: Secondary | ICD-10-CM | POA: Diagnosis not present

## 2019-08-31 DIAGNOSIS — M5442 Lumbago with sciatica, left side: Secondary | ICD-10-CM | POA: Diagnosis not present

## 2019-09-04 DIAGNOSIS — M9904 Segmental and somatic dysfunction of sacral region: Secondary | ICD-10-CM | POA: Diagnosis not present

## 2019-09-04 DIAGNOSIS — M9903 Segmental and somatic dysfunction of lumbar region: Secondary | ICD-10-CM | POA: Diagnosis not present

## 2019-09-04 DIAGNOSIS — M5442 Lumbago with sciatica, left side: Secondary | ICD-10-CM | POA: Diagnosis not present

## 2019-09-04 DIAGNOSIS — M461 Sacroiliitis, not elsewhere classified: Secondary | ICD-10-CM | POA: Diagnosis not present

## 2019-09-07 DIAGNOSIS — M9904 Segmental and somatic dysfunction of sacral region: Secondary | ICD-10-CM | POA: Diagnosis not present

## 2019-09-07 DIAGNOSIS — M5442 Lumbago with sciatica, left side: Secondary | ICD-10-CM | POA: Diagnosis not present

## 2019-09-07 DIAGNOSIS — M9903 Segmental and somatic dysfunction of lumbar region: Secondary | ICD-10-CM | POA: Diagnosis not present

## 2019-09-07 DIAGNOSIS — M461 Sacroiliitis, not elsewhere classified: Secondary | ICD-10-CM | POA: Diagnosis not present

## 2019-09-11 DIAGNOSIS — M461 Sacroiliitis, not elsewhere classified: Secondary | ICD-10-CM | POA: Diagnosis not present

## 2019-09-11 DIAGNOSIS — M9904 Segmental and somatic dysfunction of sacral region: Secondary | ICD-10-CM | POA: Diagnosis not present

## 2019-09-11 DIAGNOSIS — M5442 Lumbago with sciatica, left side: Secondary | ICD-10-CM | POA: Diagnosis not present

## 2019-09-11 DIAGNOSIS — M9903 Segmental and somatic dysfunction of lumbar region: Secondary | ICD-10-CM | POA: Diagnosis not present

## 2019-09-14 DIAGNOSIS — M9903 Segmental and somatic dysfunction of lumbar region: Secondary | ICD-10-CM | POA: Diagnosis not present

## 2019-09-14 DIAGNOSIS — M9904 Segmental and somatic dysfunction of sacral region: Secondary | ICD-10-CM | POA: Diagnosis not present

## 2019-09-14 DIAGNOSIS — M461 Sacroiliitis, not elsewhere classified: Secondary | ICD-10-CM | POA: Diagnosis not present

## 2019-09-14 DIAGNOSIS — M5442 Lumbago with sciatica, left side: Secondary | ICD-10-CM | POA: Diagnosis not present

## 2019-09-19 DIAGNOSIS — M5136 Other intervertebral disc degeneration, lumbar region: Secondary | ICD-10-CM | POA: Diagnosis not present

## 2019-09-19 DIAGNOSIS — M5416 Radiculopathy, lumbar region: Secondary | ICD-10-CM | POA: Diagnosis not present

## 2019-09-19 DIAGNOSIS — M62830 Muscle spasm of back: Secondary | ICD-10-CM | POA: Diagnosis not present

## 2019-09-21 MED ORDER — DULOXETINE HCL 60 MG PO CSDR: 60.0000 mg | DELAYED_RELEASE_CAPSULE | Freq: Every day | ORAL | 3 refills | Status: DC

## 2019-09-25 DIAGNOSIS — M5442 Lumbago with sciatica, left side: Secondary | ICD-10-CM | POA: Diagnosis not present

## 2019-09-25 DIAGNOSIS — M9903 Segmental and somatic dysfunction of lumbar region: Secondary | ICD-10-CM | POA: Diagnosis not present

## 2019-09-25 DIAGNOSIS — M9904 Segmental and somatic dysfunction of sacral region: Secondary | ICD-10-CM | POA: Diagnosis not present

## 2019-09-25 DIAGNOSIS — M461 Sacroiliitis, not elsewhere classified: Secondary | ICD-10-CM | POA: Diagnosis not present

## 2019-09-28 DIAGNOSIS — M5442 Lumbago with sciatica, left side: Secondary | ICD-10-CM | POA: Diagnosis not present

## 2019-09-28 DIAGNOSIS — M9903 Segmental and somatic dysfunction of lumbar region: Secondary | ICD-10-CM | POA: Diagnosis not present

## 2019-09-28 DIAGNOSIS — M9904 Segmental and somatic dysfunction of sacral region: Secondary | ICD-10-CM | POA: Diagnosis not present

## 2019-09-28 DIAGNOSIS — M461 Sacroiliitis, not elsewhere classified: Secondary | ICD-10-CM | POA: Diagnosis not present

## 2019-10-05 DIAGNOSIS — M461 Sacroiliitis, not elsewhere classified: Secondary | ICD-10-CM | POA: Diagnosis not present

## 2019-10-05 DIAGNOSIS — M5442 Lumbago with sciatica, left side: Secondary | ICD-10-CM | POA: Diagnosis not present

## 2019-10-05 DIAGNOSIS — M9903 Segmental and somatic dysfunction of lumbar region: Secondary | ICD-10-CM | POA: Diagnosis not present

## 2019-10-05 DIAGNOSIS — M9904 Segmental and somatic dysfunction of sacral region: Secondary | ICD-10-CM | POA: Diagnosis not present

## 2019-10-12 DIAGNOSIS — M5442 Lumbago with sciatica, left side: Secondary | ICD-10-CM | POA: Diagnosis not present

## 2019-10-12 DIAGNOSIS — M9903 Segmental and somatic dysfunction of lumbar region: Secondary | ICD-10-CM | POA: Diagnosis not present

## 2019-10-12 DIAGNOSIS — M461 Sacroiliitis, not elsewhere classified: Secondary | ICD-10-CM | POA: Diagnosis not present

## 2019-10-12 DIAGNOSIS — M9904 Segmental and somatic dysfunction of sacral region: Secondary | ICD-10-CM | POA: Diagnosis not present

## 2019-10-25 DIAGNOSIS — M5136 Other intervertebral disc degeneration, lumbar region: Secondary | ICD-10-CM | POA: Diagnosis not present

## 2019-10-25 DIAGNOSIS — M5416 Radiculopathy, lumbar region: Secondary | ICD-10-CM | POA: Diagnosis not present

## 2019-10-31 DIAGNOSIS — I351 Nonrheumatic aortic (valve) insufficiency: Secondary | ICD-10-CM | POA: Diagnosis not present

## 2019-10-31 DIAGNOSIS — R55 Syncope and collapse: Secondary | ICD-10-CM | POA: Diagnosis not present

## 2019-10-31 DIAGNOSIS — Z8679 Personal history of other diseases of the circulatory system: Secondary | ICD-10-CM | POA: Diagnosis not present

## 2019-10-31 DIAGNOSIS — R030 Elevated blood-pressure reading, without diagnosis of hypertension: Secondary | ICD-10-CM | POA: Diagnosis not present

## 2019-11-06 DIAGNOSIS — I351 Nonrheumatic aortic (valve) insufficiency: Secondary | ICD-10-CM | POA: Diagnosis not present

## 2019-11-06 DIAGNOSIS — I361 Nonrheumatic tricuspid (valve) insufficiency: Secondary | ICD-10-CM | POA: Diagnosis not present

## 2019-11-09 DIAGNOSIS — Z23 Encounter for immunization: Secondary | ICD-10-CM | POA: Diagnosis not present

## 2019-11-28 DIAGNOSIS — M5416 Radiculopathy, lumbar region: Secondary | ICD-10-CM | POA: Diagnosis not present

## 2019-11-28 DIAGNOSIS — M5136 Other intervertebral disc degeneration, lumbar region: Secondary | ICD-10-CM | POA: Diagnosis not present

## 2019-12-22 DIAGNOSIS — M5441 Lumbago with sciatica, right side: Secondary | ICD-10-CM | POA: Diagnosis not present

## 2019-12-22 DIAGNOSIS — M5442 Lumbago with sciatica, left side: Secondary | ICD-10-CM | POA: Diagnosis not present

## 2019-12-22 DIAGNOSIS — G8929 Other chronic pain: Secondary | ICD-10-CM | POA: Diagnosis not present

## 2019-12-22 DIAGNOSIS — M62830 Muscle spasm of back: Secondary | ICD-10-CM | POA: Diagnosis not present

## 2019-12-22 DIAGNOSIS — M5136 Other intervertebral disc degeneration, lumbar region: Secondary | ICD-10-CM | POA: Diagnosis not present

## 2019-12-22 DIAGNOSIS — M5416 Radiculopathy, lumbar region: Secondary | ICD-10-CM | POA: Diagnosis not present

## 2019-12-25 DIAGNOSIS — Z23 Encounter for immunization: Secondary | ICD-10-CM | POA: Diagnosis not present

## 2020-01-15 DIAGNOSIS — K581 Irritable bowel syndrome with constipation: Secondary | ICD-10-CM | POA: Diagnosis not present

## 2020-01-15 DIAGNOSIS — Z8659 Personal history of other mental and behavioral disorders: Secondary | ICD-10-CM | POA: Diagnosis not present

## 2020-01-15 DIAGNOSIS — K862 Cyst of pancreas: Secondary | ICD-10-CM | POA: Diagnosis not present

## 2020-01-15 DIAGNOSIS — R1013 Epigastric pain: Secondary | ICD-10-CM | POA: Diagnosis not present

## 2020-01-15 DIAGNOSIS — R110 Nausea: Secondary | ICD-10-CM | POA: Diagnosis not present

## 2020-01-15 DIAGNOSIS — K224 Dyskinesia of esophagus: Secondary | ICD-10-CM | POA: Diagnosis not present

## 2020-01-15 DIAGNOSIS — R140 Abdominal distension (gaseous): Secondary | ICD-10-CM | POA: Diagnosis not present

## 2020-01-22 ENCOUNTER — Other Ambulatory Visit: Payer: Self-pay | Admitting: Gastroenterology

## 2020-01-22 DIAGNOSIS — K862 Cyst of pancreas: Secondary | ICD-10-CM

## 2020-01-29 DIAGNOSIS — H401131 Primary open-angle glaucoma, bilateral, mild stage: Secondary | ICD-10-CM | POA: Diagnosis not present

## 2020-02-21 ENCOUNTER — Other Ambulatory Visit: Payer: Self-pay

## 2020-02-21 ENCOUNTER — Ambulatory Visit (INDEPENDENT_AMBULATORY_CARE_PROVIDER_SITE_OTHER): Payer: Medicare Other | Admitting: Internal Medicine

## 2020-02-21 ENCOUNTER — Encounter: Payer: Self-pay | Admitting: Internal Medicine

## 2020-02-21 VITALS — BP 132/76 | HR 79 | Temp 96.8°F | Ht 65.0 in | Wt 107.0 lb

## 2020-02-21 DIAGNOSIS — M5430 Sciatica, unspecified side: Secondary | ICD-10-CM | POA: Diagnosis not present

## 2020-02-21 DIAGNOSIS — F39 Unspecified mood [affective] disorder: Secondary | ICD-10-CM | POA: Diagnosis not present

## 2020-02-21 DIAGNOSIS — Z7189 Other specified counseling: Secondary | ICD-10-CM

## 2020-02-21 DIAGNOSIS — E039 Hypothyroidism, unspecified: Secondary | ICD-10-CM | POA: Diagnosis not present

## 2020-02-21 DIAGNOSIS — I471 Supraventricular tachycardia: Secondary | ICD-10-CM

## 2020-02-21 DIAGNOSIS — E441 Mild protein-calorie malnutrition: Secondary | ICD-10-CM | POA: Diagnosis not present

## 2020-02-21 DIAGNOSIS — K224 Dyskinesia of esophagus: Secondary | ICD-10-CM | POA: Diagnosis not present

## 2020-02-21 DIAGNOSIS — Z0000 Encounter for general adult medical examination without abnormal findings: Secondary | ICD-10-CM | POA: Diagnosis not present

## 2020-02-21 LAB — CBC
HCT: 39.6 % (ref 36.0–46.0)
Hemoglobin: 13.3 g/dL (ref 12.0–15.0)
MCHC: 33.5 g/dL (ref 30.0–36.0)
MCV: 98 fl (ref 78.0–100.0)
Platelets: 210 10*3/uL (ref 150.0–400.0)
RBC: 4.05 Mil/uL (ref 3.87–5.11)
RDW: 12.7 % (ref 11.5–15.5)
WBC: 5.6 10*3/uL (ref 4.0–10.5)

## 2020-02-21 LAB — T4, FREE: Free T4: 0.72 ng/dL (ref 0.60–1.60)

## 2020-02-21 LAB — COMPREHENSIVE METABOLIC PANEL
ALT: 24 U/L (ref 0–35)
AST: 33 U/L (ref 0–37)
Albumin: 4.8 g/dL (ref 3.5–5.2)
Alkaline Phosphatase: 73 U/L (ref 39–117)
BUN: 19 mg/dL (ref 6–23)
CO2: 32 mEq/L (ref 19–32)
Calcium: 9.7 mg/dL (ref 8.4–10.5)
Chloride: 101 mEq/L (ref 96–112)
Creatinine, Ser: 0.67 mg/dL (ref 0.40–1.20)
GFR: 80.89 mL/min (ref >60.00)
Glucose, Bld: 89 mg/dL (ref 70–99)
Potassium: 4.5 mEq/L (ref 3.5–5.1)
Sodium: 141 mEq/L (ref 135–145)
Total Bilirubin: 0.5 mg/dL (ref 0.2–1.2)
Total Protein: 7 g/dL (ref 6.0–8.3)

## 2020-02-21 LAB — TSH: TSH: 1.18 u[IU]/mL (ref 0.35–4.50)

## 2020-02-21 NOTE — Assessment & Plan Note (Signed)
Has gotten some relief from the [facility]

## 2020-02-21 NOTE — Assessment & Plan Note (Signed)
See social history 

## 2020-02-21 NOTE — Progress Notes (Addendum)
Subjective:    Patient ID: Robyn Washington, female    DOB: 1934/06/16, 85 y.o.   MRN: [id number]  HPI Here for Medicare wellness visit and follow up of chronic health conditions This visit occurred during the SARS-CoV-2 public health emergency.  Safety protocols were in place, including screening questions prior to the visit, additional usage of staff PPE, and extensive cleaning of exam room while observing appropriate contact time as indicated for disinfecting solutions.   Reviewed form and advanced directives Reviewed other doctors No alcohol or tobacco Exercising regularly--really enjoys the fitness center at [facility] is okay Hearing seems fine No falls Chronic mood issues Independent with instrumental ADLs Memory is fine  Having ongoing abdominal problems The gastroenterologist "goes round and round"----pepcid, omeprazole, peppermint oil, etc Sucralfate did help for a while Some dysphagia--like with sweet potatoes Didn't take fluconazole (esophageal biopsies negative) but has motility problems  Weight did go down some---but overall fairly stable Will have repeat MRI of pancreas to evaluate cyst again Still has nausea many days---tums occasionally helps  Ongoing back problems Still sees physiatrist Did epidural x 2---definitely helped the sciatica  Ongoing mood problems Social interaction---like at the fitness center--does help Has noticed some plateau with the increased duloxetine (some better)  Recent cardiology evaluation at [facility] Did have 8 episodes of SVT--and occasional palpitations Did try vagal maneuver for this (with bad spell with diaphoresis) No chest pain or SOB May want to transfer care locally  Current Outpatient Medications on File Prior to Visit  Medication Sig Dispense Refill  . Calcium Carbonate-Vit D-Min (CALCIUM 600+D PLUS MINERALS) 600-400 MG-UNIT TABS Take by mouth.    . doxycycline (ADOXA) 100 MG tablet Take 1 tablet (100 mg total) by  mouth daily. (Patient taking differently: Take 100 mg by mouth daily as needed.) 90 tablet 2  . DULoxetine HCl 60 MG CSDR Take 60 mg by mouth daily. 90 capsule 3  . famotidine (PEPCID) 20 MG tablet Take 1 tablet (20 mg total) by mouth 2 (two) times daily. (Patient taking differently: Take 20 mg by mouth 2 (two) times daily as needed.) 60 tablet 11  . fexofenadine (ALLEGRA) 180 MG tablet Take by mouth.    . levothyroxine (SYNTHROID) 75 MCG tablet Take 1 tablet (75 mcg total) by mouth daily. 90 tablet 3  . Multiple Vitamins-Minerals (CENTRUM SILVER) tablet Take by mouth.    Marland Kitchen omeprazole (PRILOSEC) 20 MG capsule Take 20 mg by mouth daily.    . timolol (TIMOPTIC) 0.5 % ophthalmic solution     . tiZANidine (ZANAFLEX) 2 MG tablet Take 1 tablet (2 mg total) by mouth 3 (three) times daily as needed for muscle spasms. 30 tablet 0  . triamcinolone cream (KENALOG) 0.1 % Apply to itchy rash 1-2 times daily until improved. Avoid face, groin, underarms. 240 g 0   No current facility-administered medications on file prior to visit.    Allergies  Allergen Reactions  . Bioflavonoids Swelling    Swelling of lips Swelling of lips   . Banana   . Barley Grass Other (See Comments)    Other reaction(s): Other (See Comments)  . Pear Other (See Comments)    Other reaction(s): Other (See Comments) Feels bad   . Pineapple   . Rice Other (See Comments)    Rhinitis. Rhinitis.   . Rye Grass Flower Pollen Extract  [Gramineae Pollens] Other (See Comments)  . Wheat Bran Other (See Comments)  . Hydroxychloroquine Rash    Past Medical History:  Diagnosis Date  . Basal cell carcinoma 2014   Right Forehead, MOHS  . Diverticulitis    hospitalized 2013  . Hypothyroidism   . IBS (irritable bowel syndrome)   . Lumbar degenerative disc disease   . Mood disorder (HCC) 2011   adjustment issues mostly to moving and husband's illness  . Osteopenia   . Raynaud's syndrome   . Rosacea     Past Surgical History:   Procedure Laterality Date  . MOHS SURGERY  2017   BSC?  Marland Kitchen PILONIDAL CYST EXCISION  1958  . TONSILLECTOMY AND ADENOIDECTOMY      Family History  Problem Relation Age of Onset  . Diabetes Mother   . Alcohol abuse Father   . Thyroid cancer Daughter     Social History   Socioeconomic History  . Marital status: Widowed    Spouse name: Not on file  . Number of children: 2  . Years of education: Not on file  . Highest education level: Not on file  Occupational History  . Occupation: Environmental health practitioner    Comment: Public librarian  Tobacco Use  . Smoking status: Never Smoker  . Smokeless tobacco: Never Used  Substance and Sexual Activity  . Alcohol use: Never  . Drug use: Not on file  . Sexual activity: Not on file  Other Topics Concern  . Not on file  Social History Narrative   2 daughters      Has living will   Daughter Lanora Manis Dealer) then [employer] [phone]) have health care POA   Would accept resuscitation but no prolonged ventilation or tube feeds   Social Determinants of Health   Financial Resource Strain: Not on file  Food Insecurity: Not on file  Transportation Needs: Not on file  Physical Activity: Not on file  Stress: Not on file  Social Connections: Not on file  Intimate Partner Violence: Not on file   Review of Systems Sleep is fine Wears seat belt Teeth are fine No suspicious skin lesions currently Mild hand pains in addition to back--some numbness in hands as well Bruises very easily    Objective:   Physical Exam Constitutional:      Comments: Still some wasting  HENT:     Mouth/Throat:     Comments: No lesions Eyes:     Extraocular Movements: Extraocular movements intact.     Pupils: Pupils are equal, round, and reactive to light.  Cardiovascular:     Rate and Rhythm: Normal rate and regular rhythm.     Pulses: Normal pulses.     Heart sounds: No murmur heard. No gallop.   Pulmonary:     Effort: Pulmonary effort is  normal.     Breath sounds: Normal breath sounds. No wheezing or rales.  Abdominal:     Palpations: Abdomen is soft.     Comments: Mild non specific right sided tenderness  Musculoskeletal:     Cervical back: Neck supple.     Right lower leg: No edema.     Left lower leg: No edema.  Lymphadenopathy:     Cervical: No cervical adenopathy.  Skin:    General: Skin is warm.     Findings: No rash.  Neurological:     Mental Status: She is alert and oriented to person, place, and time.     Comments: President--- "Liliane Channel, Obama" [phone] [id number] Recall 3/3  Psychiatric:        Mood and Affect: Mood normal.  Behavior: Behavior normal.            Assessment & Plan:

## 2020-02-21 NOTE — Assessment & Plan Note (Signed)
Still underweight but seems to have stabilized Discussed high calorie foods and protein

## 2020-02-21 NOTE — Assessment & Plan Note (Signed)
Did change to generic levothyroxine Will check levels

## 2020-02-21 NOTE — Assessment & Plan Note (Signed)
Still with dysthymia and some degree of grieving The duloxetine helps this---and may help the chronic abdominal symptoms

## 2020-02-21 NOTE — Assessment & Plan Note (Signed)
I have personally reviewed the Medicare Annual Wellness questionnaire and have noted 1. The patient's medical and social history 2. Their use of alcohol, tobacco or illicit drugs 3. Their current medications and supplements 4. The patient's functional ability including ADL's, fall risks, home safety risks and hearing or visual             impairment. 5. Diet and physical activities 6. Evidence for depression or mood disorders  The patients weight, height, BMI and visual acuity have been recorded in the chart I have made referrals, counseling and provided education to the patient based review of the above and I have provided the pt with a written personalized care plan for preventive services.  I have provided you with a copy of your personalized plan for preventive services. Please take the time to review along with your updated medication list.  Had COVID booster and flu vaccine Will consider shingrix Td at pharmacy Exercises regularly

## 2020-02-21 NOTE — Assessment & Plan Note (Signed)
Ongoing symptoms  No easy answers but is on PPI

## 2020-02-21 NOTE — Assessment & Plan Note (Signed)
Does have some spells Tries vagal maneuvers Still sees cardiology--may want to switch to [facility]

## 2020-03-08 DIAGNOSIS — I471 Supraventricular tachycardia: Secondary | ICD-10-CM

## 2020-03-13 ENCOUNTER — Other Ambulatory Visit: Payer: Self-pay

## 2020-03-13 NOTE — Progress Notes (Signed)
Fax request for rosacea cream. New rx sent through [facility].

## 2020-04-05 ENCOUNTER — Other Ambulatory Visit: Payer: Self-pay

## 2020-04-05 ENCOUNTER — Ambulatory Visit
Admission: RE | Admit: 2020-04-05 | Discharge: 2020-04-05 | Disposition: A | Discharge status: Home / Self Care | Payer: Medicare Other | Source: Ambulatory Visit | Attending: Gastroenterology | Admitting: Gastroenterology

## 2020-04-05 DIAGNOSIS — K862 Cyst of pancreas: Secondary | ICD-10-CM | POA: Insufficient documentation

## 2020-04-05 MED ORDER — GADOBUTROL 1 MMOL/ML IV SOLN
4.0000 mL | Freq: Once | INTRAVENOUS | Status: AC | PRN
  Administered 2020-04-05: 4 mL via INTRAVENOUS

## 2020-05-12 NOTE — Progress Notes (Signed)
Cardiology Office Note  Date:  05/13/2020   ID:  Robyn Washington, DOB 1934/04/26, MRN [id number]  PCP:  Venia Carbon, MD   Chief Complaint  Patient presents with  . NEW patient-Referred by Dr. Silvio Pate for SVT    Changing to a cardiologist closer to home    HPI:  Robyn Washington is a 86 y.o. female with a history of  SVT,  mild aortic insufficiency, chronic venous insufficiency  Osteoporosis,back issues Nutcracker esoph who presents for new patient evaluation for her near syncope, syncope  No recent syncope or syncope BP low in the Am,  SOB in the Am, walking  Weight stable, up 3 pounds 107 to 110 pounds Likes bread  episode of near syncope last august 2021  got out of bed when she felt very lightheaded and weak.  She had to sit back down.   nauseated and noticed her heart racing. She put her head between her knees. She was drenched in perspiration but did not lose consciousness.  She walked to the bathroom and felt like she was pale when looking at herself in the mirror. She cannot identify any triggers. She slept okay the night before.  brief palpitations  exercising more at the fitness center.  treadmill for 30 to 40 minutes/day , flat  Referred by Dr. Silvio Pate for SVT, near syncope  prior cardiology evaluation at [facility] Did have 8 episodes of SVT--and occasional palpitations Did try vagal maneuver for this (with bad spell with diaphoresis) No chest pain or SOB  Echo 1. The left ventricle is normal in size with normal wall thickness. 2. The left ventricular systolic function is normal, LVEF is visually estimated at > 55%. 3. The aortic valve is trileaflet with mildly thickened leaflets with normal excursion. 4. There is mild to moderate aortic regurgitation. 5. The right ventricle is normal in size, with normal systolic function. 6. There is mild to moderate tricuspid regurgitation. 7. IVC size and inspiratory change suggest mildly elevated right  atrial pressure. (5-10 mmHg).   PMH:   has a past medical history of Basal cell carcinoma (2014), Diverticulitis, Hypothyroidism, IBS (irritable bowel syndrome), Lumbar degenerative disc disease, Mood disorder ([place]) (2011), Osteopenia, Raynaud's syndrome, and Rosacea.  PSH:    Past Surgical History:  Procedure Laterality Date  . MOHS SURGERY  2017   BSC?  Marland Kitchen PILONIDAL CYST EXCISION  1958  . TONSILLECTOMY AND ADENOIDECTOMY      Current Outpatient Medications  Medication Sig Dispense Refill  . Calcium Carbonate-Vit D-Min (CALCIUM 600+D PLUS MINERALS) 600-400 MG-UNIT TABS Take by mouth.    . denosumab (PROLIA) 60 MG/ML SOSY injection Inject 60 mg into the skin every 6 (six) months.    . doxycycline (ADOXA) 100 MG tablet Take 100 mg by mouth daily as needed (for rosacea flares).    . DULoxetine HCl 60 MG CSDR Take 60 mg by mouth daily. 90 capsule 3  . famotidine (PEPCID) 20 MG tablet Take 20 mg by mouth 2 (two) times daily as needed for heartburn or indigestion.    . fexofenadine (ALLEGRA) 180 MG tablet Take by mouth.    . levothyroxine (SYNTHROID) 75 MCG tablet Take 1 tablet (75 mcg total) by mouth daily. 90 tablet 3  . Multiple Vitamins-Minerals (CENTRUM SILVER) tablet Take by mouth.    Marland Kitchen omeprazole (PRILOSEC) 20 MG capsule Take 20 mg by mouth daily as needed.    . timolol (TIMOPTIC) 0.5 % ophthalmic solution     . triamcinolone cream (KENALOG)  0.1 % Apply to itchy rash 1-2 times daily until improved. Avoid face, groin, underarms. 240 g 0   No current facility-administered medications for this visit.     Allergies:   Bioflavonoids, Banana, Barley grass, Pear, Pineapple, Rice, Rye grass flower pollen extract  [gramineae pollens], Wheat bran, and Hydroxychloroquine   Social History:  The patient  reports that she has never smoked. She has never used smokeless tobacco. She reports that she does not drink alcohol and does not use drugs.   Family History:   family history includes  Alcohol abuse in her father; Diabetes in her mother and sister; Thyroid cancer in her daughter.    Review of Systems: Review of Systems  Constitutional: Negative.   HENT: Negative.   Respiratory: Negative.   Cardiovascular: Negative.   Gastrointestinal: Negative.   Musculoskeletal: Negative.   Neurological: Negative.   Psychiatric/Behavioral: Negative.   All other systems reviewed and are negative.    PHYSICAL EXAM: VS:  BP 140/70 (BP Location: Right Arm, Patient Position: Sitting, Cuff Size: Normal)   Pulse 61   Ht 5\' 4"  (1.626 m)   Wt 110 lb (49.9 kg)   BMI 18.88 kg/m  , BMI Body mass index is 18.88 kg/m. GEN: Well nourished, well developed, in no acute distress HEENT: normal Neck: no JVD, carotid bruits, or masses Cardiac: RRR; no murmurs, rubs, or gallops,no edema  Respiratory:  clear to auscultation bilaterally, normal work of breathing GI: soft, nontender, nondistended, + BS MS: no deformity or atrophy Skin: warm and dry, no rash Neuro:  Strength and sensation are intact Psych: euthymic mood, full affect   Recent Labs: 02/21/2020: ALT 24; BUN 19; Creatinine, Ser 0.67; Hemoglobin 13.3; Platelets 210.0; Potassium 4.5; Sodium 141; TSH 1.18    Lipid Panel No results found for: CHOL, HDL, LDLCALC, TRIG    Wt Readings from Last 3 Encounters:  05/13/20 110 lb (49.9 kg)  02/21/20 107 lb (48.5 kg)  03/19/19 106 lb (48.1 kg)       ASSESSMENT AND PLAN:  Problem List Items Addressed This Visit      Cardiology Problems   SVT (supraventricular tachycardia) ([place]) - Primary   Relevant Orders   EKG 12-Lead     Other   Malnutrition of mild degree ([place])    Other Visit Diagnoses    Near syncope       Relevant Orders   EKG 12-Lead     1. Near syncope Consider orthostasis and/or vasovagal episode.  Weight is low, recommend she stay hydrated, reports low blood pressure in the morning Recommended aggressive hydration first thing Unable to exclude  arrhythmogenic  For further episodes could consider a ZIO monitor  2. SVT (supraventricular tachycardia)  results reviewed, Zio patch monitor 4/11 showing 2.1% PVC burden as well as 8 episodes of narrow complex tachycardia.  Patient reports occasional palpitations.  We will try to avoid medications at this time given low blood pressure at home first thing in the morning We will try to avoid antiarrhythmics  3. Aortic valve insufficiency Mild to moderate on echo No significant murmur on exam, no need for repeat echo at this time  4. HTN Low BP in the Am Weight stable Stay hydrated first thing in the morning especially    Total encounter time more than 60 minutes  Greater than 50% was spent in counseling and coordination of care with the patient    Signed, Esmond Plants, M.D., Ph.D. [place], [place]

## 2020-05-13 ENCOUNTER — Encounter: Payer: Self-pay | Admitting: Cardiovascular Disease

## 2020-05-13 ENCOUNTER — Ambulatory Visit (INDEPENDENT_AMBULATORY_CARE_PROVIDER_SITE_OTHER): Payer: Medicare Other | Admitting: Cardiovascular Disease

## 2020-05-13 ENCOUNTER — Other Ambulatory Visit: Payer: Self-pay | Admitting: Internal Medicine

## 2020-05-13 ENCOUNTER — Other Ambulatory Visit: Payer: Self-pay

## 2020-05-13 VITALS — BP 140/70 | HR 61 | Ht 64.0 in | Wt 110.0 lb

## 2020-05-13 DIAGNOSIS — E441 Mild protein-calorie malnutrition: Secondary | ICD-10-CM

## 2020-05-13 DIAGNOSIS — I471 Supraventricular tachycardia: Secondary | ICD-10-CM | POA: Diagnosis not present

## 2020-05-13 DIAGNOSIS — R55 Syncope and collapse: Secondary | ICD-10-CM

## 2020-05-13 NOTE — Patient Instructions (Addendum)
Medication Instructions:  No changes  If you need a refill on your cardiac medications before your next appointment, please call your pharmacy.    Lab work: No new labs needed   If you have labs (blood work) drawn today and your tests are completely normal, you will receive your results only by: . MyChart Message (if you have MyChart) OR . A paper copy in the mail If you have any lab test that is abnormal or we need to change your treatment, we will call you to review the results.   Testing/Procedures: No new testing needed   Follow-Up: At [facility], you and your health needs are our priority.  As part of our continuing mission to provide you with exceptional heart care, we have created designated Provider Care Teams.  These Care Teams include your primary Cardiologist (physician) and Advanced Practice Providers (APPs -  Physician Assistants and Nurse Practitioners) who all work together to provide you with the care you need, when you need it.  . You will need a follow up appointment in 6 months  . Providers on your designated Care Team:   . Christopher Berge, NP . Ryan Dunn, PA-C . Jacquelyn Visser, PA-C  Any Other Special Instructions Will Be Listed Below (If Applicable).  COVID-19 Vaccine Information can be found at: [email or web address] For questions related to vaccine distribution or appointments, please email [email or web address] or call [phone].     

## 2020-07-01 DIAGNOSIS — K224 Dyskinesia of esophagus: Secondary | ICD-10-CM

## 2020-07-02 ENCOUNTER — Encounter: Payer: Self-pay | Admitting: Dermatology

## 2020-07-02 ENCOUNTER — Other Ambulatory Visit: Payer: Self-pay

## 2020-07-02 ENCOUNTER — Encounter: Payer: Self-pay | Admitting: *Deleted

## 2020-07-02 ENCOUNTER — Ambulatory Visit (INDEPENDENT_AMBULATORY_CARE_PROVIDER_SITE_OTHER): Payer: Medicare Other | Admitting: Dermatology

## 2020-07-02 DIAGNOSIS — D229 Melanocytic nevi, unspecified: Secondary | ICD-10-CM

## 2020-07-02 DIAGNOSIS — L719 Rosacea, unspecified: Secondary | ICD-10-CM

## 2020-07-02 DIAGNOSIS — L820 Inflamed seborrheic keratosis: Secondary | ICD-10-CM

## 2020-07-02 DIAGNOSIS — L814 Other melanin hyperpigmentation: Secondary | ICD-10-CM

## 2020-07-02 DIAGNOSIS — L720 Epidermal cyst: Secondary | ICD-10-CM

## 2020-07-02 DIAGNOSIS — Z85828 Personal history of other malignant neoplasm of skin: Secondary | ICD-10-CM | POA: Diagnosis not present

## 2020-07-02 DIAGNOSIS — R21 Rash and other nonspecific skin eruption: Secondary | ICD-10-CM

## 2020-07-02 DIAGNOSIS — D1800 Hemangioma unspecified site: Secondary | ICD-10-CM

## 2020-07-02 DIAGNOSIS — L821 Other seborrheic keratosis: Secondary | ICD-10-CM

## 2020-07-02 DIAGNOSIS — L738 Other specified follicular disorders: Secondary | ICD-10-CM | POA: Diagnosis not present

## 2020-07-02 DIAGNOSIS — L578 Other skin changes due to chronic exposure to nonionizing radiation: Secondary | ICD-10-CM

## 2020-07-02 DIAGNOSIS — Z1283 Encounter for screening for malignant neoplasm of skin: Secondary | ICD-10-CM

## 2020-07-02 MED ORDER — DOXYCYCLINE MONOHYDRATE 100 MG PO TABS: 100.0000 mg | ORAL_TABLET | Freq: Every day | ORAL | 5 refills | Status: DC | PRN

## 2020-07-02 NOTE — Patient Instructions (Addendum)
Tazorac Cream - spot treat milia (small white cysts) on face every night as tolerated.  Cryotherapy Aftercare  . Wash gently with soap and water everyday.   Marland Kitchen Apply Vaseline and Band-Aid daily until healed.  Rosacea is a chronic progressive skin condition usually affecting the face of adults, causing redness and/or acne bumps. It is treatable but not curable. It sometimes affects the eyes (ocular rosacea) as well. It may respond to topical and/or systemic medication and can flare with stress, sun exposure, alcohol, exercise and some foods.  Daily application of broad spectrum spf 30+ sunscreen to face is recommended to reduce flares.  Dry Skin Care  What causes dry skin?  Dry skin is common and results from inadequate moisture in the outer skin layers. Dry skin usually results from the excessive loss of moisture from the skin surface. This occurs due to two major factors: 1. Normally the skin's oil glands deposit a layer of oil on the skin's surface. This layer of oil prevents the loss of moisture from the skin. Exposure to soaps, cleaners, solvents, and disinfectants removes this oily film, allowing water to escape. 2. Water loss from the skin increases when the humidity is low. During winter months we spend a lot of time indoors where the air is heated. Heated air has very low humidity. This also contributes to dry skin.  A tendency for dry skin may accompany such disorders as eczema. Also, as people age, the number of functioning oil glands decreases, and the tendency toward dry skin can be a sensation of skin tightness when emerging from the shower.  How do I manage dry skin?  1. Humidify your environment. This can be accomplished by using a humidifier in your bedroom at night during winter months. 2. Bathing can actually put moisture back into your skin if done right. Take the following steps while bathing to sooth dry skin:  Avoid hot water, which only dries the skin and makes itching  worse. Use warm water.  Avoid washcloths or extensive rubbing or scrubbing.  Use mild soaps like unscented Dove, Oil of Olay, Cetaphil, Basis, or CeraVe.  If you take baths rather than showers, rinse off soap residue with clean water before getting out of tub.  Once out of the shower/tub, pat dry gently with a soft towel. Leave your skin damp.  While still damp, apply any medicated ointment/cream you were prescribed to the affected areas. After you apply your medicated ointment/cream, then apply your moisturizer to your whole body.This is the most important step in dry skin care. If this is omitted, your skin will continue to be dry.  The choice of moisturizer is also very important. In general, lotion will not provider enough moisture to severely dry skin because it is water based. You should use an ointment or cream. Moisturizers should also be unscented. Good choices include Vaseline (plain petrolatum), Aquaphor, Cetaphil, CeraVe, Vanicream, DML Forte, Aveeno moisture, or Eucerin Cream.  Bath oils can be helpful, but do not replace the application of moisturizer after the bath. In addition, they make the tub slippery causing an increased risk for falls. Therefore, we do not recommend their use.  Continue Triamcinolone 0.1% Cream Apply 1-2 times a day as needed for itchy rash. Avoid face, groin, underarms.  Topical steroids (such as triamcinolone, fluocinolone, fluocinonide, mometasone, clobetasol, halobetasol, betamethasone, hydrocortisone) can cause thinning and lightening of the skin if they are used for too long in the same area. Your physician has selected the right strength  medicine for your problem and area affected on the body. Please use your medication only as directed by your physician to prevent side effects.    If you have any questions or concerns for your doctor, please call our main line at [phone] and press option 4 to reach your doctor's medical assistant. If no one  answers, please leave a voicemail as directed and we will return your call as soon as possible. Messages left after 4 pm will be answered the following business day.   You may also send [place] a message via [facility]. We typically respond to MyChart messages within 1-2 business days.  For prescription refills, please ask your pharmacy to contact our office. Our fax number is [phone].  If you have an urgent issue when the clinic is closed that cannot wait until the next business day, you can page your doctor at the number below.    Please note that while we do our best to be available for urgent issues outside of office hours, we are not available 24/7.   If you have an urgent issue and are unable to reach [place], you may choose to seek medical care at your doctor's office, retail clinic, urgent care center, or emergency room.  If you have a medical emergency, please immediately call 911 or go to the emergency department.  Pager Numbers  - Dr. Nehemiah Massed: [phone]  - Dr. Laurence Ferrari: [phone]  - Dr. Nicole Kindred: [phone]  In the event of inclement weather, please call our main line at [phone] for an update on the status of any delays or closures.  Dermatology Medication Tips: Please keep the boxes that topical medications come in in order to help keep track of the instructions about where and how to use these. Pharmacies typically print the medication instructions only on the boxes and not directly on the medication tubes.   If your medication is too expensive, please contact our office at [phone] option 4 or send [place] a message through [place].   We are unable to tell what your co-pay for medications will be in advance as this is different depending on your insurance coverage. However, we may be able to find a substitute medication at lower cost or fill out paperwork to get insurance to cover a needed medication.   If a prior authorization is required to get your medication covered  by your insurance company, please allow [place] 1-2 business days to complete this process.  Drug prices often vary depending on where the prescription is filled and some pharmacies may offer cheaper prices.  The website www.goodrx.com contains coupons for medications through different pharmacies. The prices here do not account for what the cost may be with help from insurance (it may be cheaper with your insurance), but the website can give you the price if you did not use any insurance.  - You can print the associated coupon and take it with your prescription to the pharmacy.  - You may also stop by our office during regular business hours and pick up a GoodRx coupon card.  - If you need your prescription sent electronically to a different pharmacy, notify our office through [facility] or by phone at [phone] option 4.

## 2020-07-02 NOTE — Progress Notes (Signed)
Follow-Up Visit   Subjective  Robyn Washington is a 86 y.o. female who presents for the following: Annual Exam (Patient presents for TBSE. She has a history of BCC of the right forehead. She has Rosacea and uses cream from [facility] and doxycycline 100mg  as needed. Patient has a history of urticaria vs dermatitis of the shoulders, back, hip. She treats with TMC 0.1% cream as needed.). She also has a growth in her right scalp that she picks at and is irritated.  Also itchy spots at L abdomen at waistline.   The following portions of the chart were reviewed this encounter and updated as appropriate:       Review of Systems:  No other skin or systemic complaints except as noted in HPI or Assessment and Plan.  Objective  Well appearing patient in no apparent distress; mood and affect are within normal limits.  A full examination was performed including scalp, head, eyes, ears, nose, lips, neck, chest, axillae, abdomen, back, buttocks, bilateral upper extremities, bilateral lower extremities, hands, feet, fingers, toes, fingernails, and toenails. All findings within normal limits unless otherwise noted below.  Objective  Left Temple, Left Paranasal: Firm flesh yellow papules  Left temple 3.45mm  Objective  face: Erythema on nose and malar cheeks.  Objective  R temporal scalp x 1, L abd x 4 (5): Erythematous keratotic or waxy stuck-on papule or plaque.   Objective  Back, shoulders: Pink excoriated papules left post shoulder, left lower back   Assessment & Plan  Sebaceous hyperplasia Left Temple, Left Paranasal  Benign, observe.    Rosacea face  Controlled Rosacea is a chronic progressive skin condition usually affecting the face of adults, causing redness and/or acne bumps. It is treatable but not curable. It sometimes affects the eyes (ocular rosacea) as well. It may respond to topical and/or systemic medication and can flare with stress, sun exposure, alcohol,  exercise and some foods.  Daily application of broad spectrum spf 30+ sunscreen to face is recommended to reduce flares.  Continue Azelaic Acid/Metronidazole/Ivermectin Cream 15%/1%/1% QHS.  Pt has.  [facility] [phone]. Patient will call for refills.  Continue doxycycline 100mg  1 po QD prn, or patient may decrease to 1/2 tablet QD.  Doxycycline should be taken with food to prevent nausea. Do not lay down for 30 minutes after taking. Be cautious with sun exposure and use good sun protection while on this medication. Pregnant women should not take this medication.      Inflamed seborrheic keratosis (5) R temporal scalp x 1, L abd x 4  Prior to procedure, discussed risks of blister formation, small wound, skin dyspigmentation, or rare scar following cryotherapy.    Destruction of lesion - R temporal scalp x 1, L abd x 4  Destruction method: cryotherapy   Informed consent: discussed and consent obtained   Lesion destroyed using liquid nitrogen: Yes   Region frozen until ice ball extended beyond lesion: Yes   Outcome: patient tolerated procedure well with no complications   Post-procedure details: wound care instructions given    Rash Back, shoulders  Urticaria vs Dermatitis- chronic condition- controlled  Recommend mild soap and moisturizing cream 1-2 times daily.  Gentle skin care handout provided.   Continue TMC 0.1% Cream Apply qd/bid Aas rash prn. Avoid face, groin, axilla. Pt has, will call for refills.  Topical steroids (such as triamcinolone, fluocinolone, fluocinonide, mometasone, clobetasol, halobetasol, betamethasone, hydrocortisone) can cause thinning and lightening of the skin if they are used for  too long in the same area. Your physician has selected the right strength medicine for your problem and area affected on the body. Please use your medication only as directed by your physician to prevent side effects.    Other Related Medications triamcinolone cream  (KENALOG) 0.1 %  Skin cancer screening performed today.  Actinic Damage - chronic, secondary to cumulative UV radiation exposure/sun exposure over time - diffuse scaly erythematous macules with underlying dyspigmentation - Recommend daily broad spectrum sunscreen SPF 30+ to sun-exposed areas, reapply every 2 hours as needed.  - Recommend staying in the shade or wearing long sleeves, sun glasses (UVA+UVB protection) and wide brim hats (4-inch brim around the entire circumference of the hat). - Call for new or changing lesions.  Lentigines - Scattered tan macules - Due to sun exposure - Benign-appering, observe - Recommend daily broad spectrum sunscreen SPF 30+ to sun-exposed areas, reapply every 2 hours as needed. - Call for any changes  Seborrheic Keratoses - Stuck-on, waxy, tan-brown papules and/or plaques  - Benign-appearing - Discussed benign etiology and prognosis. - Observe - Call for any changes  History of Basal Cell Carcinoma of the Skin - No evidence of recurrence today of the right forehead - Recommend regular full body skin exams - Recommend daily broad spectrum sunscreen SPF 30+ to sun-exposed areas, reapply every 2 hours as needed.  - Call if any new or changing lesions are noted between office visits  Melanocytic Nevi - Tan-brown and/or pink-flesh-colored symmetric macules and papules - Benign appearing on exam today - Observation - Call clinic for new or changing moles - Recommend daily use of broad spectrum spf 30+ sunscreen to sun-exposed areas.   Hemangiomas - Red papules - Discussed benign nature - Observe - Call for any changes  Milia - tiny firm white papules of the right malar cheek - type of cyst - benign - may be extracted if symptomatic -Start Tazorac 0.05% Cream - spot treat affected areas - observe  Return in about 1 year (around 07/02/2021) for TBSE.   IJamesetta Orleans, CMA, am acting as scribe for Brendolyn Patty, MD .  Documentation: I  have reviewed the above documentation for accuracy and completeness, and I agree with the above.  Brendolyn Patty MD

## 2020-08-11 ENCOUNTER — Other Ambulatory Visit: Payer: Self-pay | Admitting: Internal Medicine

## 2020-08-14 ENCOUNTER — Other Ambulatory Visit: Payer: Self-pay | Admitting: Internal Medicine

## 2020-08-27 ENCOUNTER — Ambulatory Visit (INDEPENDENT_AMBULATORY_CARE_PROVIDER_SITE_OTHER): Payer: Medicare Other | Admitting: Gastroenterology

## 2020-08-27 ENCOUNTER — Telehealth: Payer: Self-pay | Admitting: Internal Medicine

## 2020-08-27 ENCOUNTER — Encounter: Payer: Self-pay | Admitting: Gastroenterology

## 2020-08-27 ENCOUNTER — Other Ambulatory Visit: Payer: Self-pay

## 2020-08-27 VITALS — BP 162/80 | HR 87 | Ht 64.0 in | Wt 106.4 lb

## 2020-08-27 DIAGNOSIS — K219 Gastro-esophageal reflux disease without esophagitis: Secondary | ICD-10-CM

## 2020-08-27 DIAGNOSIS — K581 Irritable bowel syndrome with constipation: Secondary | ICD-10-CM | POA: Diagnosis not present

## 2020-08-27 NOTE — Telephone Encounter (Signed)
Patient call in requesting info on a appointment she got on Mychart about medication refills/ patient engagement .

## 2020-08-27 NOTE — Progress Notes (Signed)
Gastroenterology Consultation  Referring Provider:     Venia Carbon, MD Primary Care Physician:  Venia Carbon, MD Primary Gastroenterologist:  Dr. Allen Norris     Reason for Consultation:     Esophageal dysmotility        HPI:   Briahnna Harries is a 86 y.o. y/o female referred for consultation & management of esophageal dysmotility by Dr. Silvio Pate, Theophilus Kinds, MD. This patient comes in today after being seen by multiple gastroenterologist in the past.  The patient was seen at [facility] in 2020 with a few interactions and one of them being a esophageal motility study showing:  Impression:        - Manometry indicating mild ineffective esophageal motility.    The patient was then seen all of last year including up until December at the [facility] clinic for GERD, a pancreatic cyst, constipation and IBS.  She is now here to see me for her esophageal dysmotility. She had a DU as a young age.  The patient reports nausea with a bad taste in her mouth especially when she bends over. She has been helped in the past by Fluconazole. She has a history of candida esophagitis. The patient reports that she has not been happy with the previous care she got it down and wanted to switch to me.  The patient also reports that she was very happy with her liver specialist at [facility] but she states he is no longer practicing in her area. The patient reports that she has epigastric pain that comes quite often. She also reports that she has constipation that she takes fiber for.  She reports the fiber makes her stools somewhat between normal and slightly constipated.  Past Medical History:  Diagnosis Date   Basal cell carcinoma 2014   Right Forehead, MOHS   Diverticulitis    hospitalized 2013   Hypothyroidism    IBS (irritable bowel syndrome)    Lumbar degenerative disc disease    Mood disorder ([place]) 2011   adjustment issues mostly to moving and husband's illness   Osteopenia    Raynaud's syndrome    Rosacea      Past Surgical History:  Procedure Laterality Date   MOHS SURGERY  2017   [facility]?   PILONIDAL CYST EXCISION  1958   TONSILLECTOMY AND ADENOIDECTOMY      Prior to Admission medications   Medication Sig Start Date End Date Taking? Authorizing Provider  Calcium Carbonate-Vit D-Min (CALCIUM 600+D PLUS MINERALS) 600-400 MG-UNIT TABS Take by mouth.    [provider]  denosumab (PROLIA) 60 MG/ML SOSY injection Inject 60 mg into the skin every 6 (six) months.    [provider]  doxycycline (ADOXA) 100 MG tablet Take 1 tablet (100 mg total) by mouth daily as needed (for rosacea flares). 07/02/20   Brendolyn Patty, MD  DULoxetine (CYMBALTA) 60 MG capsule TAKE 1 CAPSULE BY MOUTH  DAILY 08/12/20   Venia Carbon, MD  famotidine (PEPCID) 20 MG tablet Take 20 mg by mouth 2 (two) times daily as needed for heartburn or indigestion.    [provider]  fexofenadine (ALLEGRA) 180 MG tablet Take by mouth.    [provider]  levothyroxine (SYNTHROID) 75 MCG tablet TAKE 1 TABLET BY MOUTH  DAILY 05/13/20   Venia Carbon, MD  Multiple Vitamins-Minerals (CENTRUM SILVER) tablet Take by mouth. 01/09/10   [provider]  omeprazole (PRILOSEC) 20 MG capsule Take 20 mg by mouth daily as needed.  [provider]  timolol (TIMOPTIC) 0.5 % ophthalmic solution  02/27/16   [provider]  triamcinolone cream (KENALOG) 0.1 % Apply to itchy rash 1-2 times daily until improved. Avoid face, groin, underarms. 07/03/19   Brendolyn Patty, MD    Family History  Problem Relation Age of Onset   Diabetes Mother    Alcohol abuse Father    Diabetes Sister    Thyroid cancer Daughter      Social History   Tobacco Use   Smoking status: Never   Smokeless tobacco: Never  Vaping Use   Vaping Use: Never used  Substance Use Topics   Alcohol use: Never   Drug use: Never    Allergies as of 08/27/2020 - Review Complete 07/02/2020  Allergen Reaction Noted    Bioflavonoids Swelling 07/29/2012   Banana  01/22/2016   Barley grass Other (See Comments) 07/16/2015   Pear Other (See Comments) 07/16/2015   Pineapple  01/22/2016   Rice Other (See Comments) 07/29/2012   Rye grass flower pollen extract  [gramineae pollens] Other (See Comments) 07/16/2015   Wheat bran Other (See Comments) 07/16/2015   Hydroxychloroquine Rash 02/04/2017    Review of Systems:    All systems reviewed and negative except where noted in HPI.   Physical Exam:  There were no vitals taken for this visit. No LMP recorded. Patient is postmenopausal. General:   Alert,  Well-developed, well-nourished, pleasant and cooperative in NAD Head:  Normocephalic and atraumatic. Eyes:  Sclera clear, no icterus.   Conjunctiva pink. Ears:  Normal auditory acuity. Neck:  Supple; no masses or thyromegaly. Lungs:  Respirations even and unlabored.  Clear throughout to auscultation.   No wheezes, crackles, or rhonchi. No acute distress. Heart:  Regular rate and rhythm; no murmurs, clicks, rubs, or gallops. Abdomen:  Normal bowel sounds.  No bruits.  Soft, non-tender and non-distended without masses, hepatosplenomegaly or hernias noted.  No guarding or rebound tenderness.  Negative Carnett sign.   Rectal:  Deferred.  Pulses:  Normal pulses noted. Extremities:  No clubbing or edema.  No cyanosis. Neurologic:  Alert and oriented x3;  grossly normal neurologically. Skin:  Intact without significant lesions or rashes.  No jaundice. Lymph Nodes:  No significant cervical adenopathy. Psych:  Alert and cooperative. Normal mood and affect.  Imaging Studies: No results found.  Assessment and Plan:   Sabreena Vogan is a 86 y.o. y/o female who comes in today with a history of irritable bowel syndrome a pancreatic cyst that has remained at 0.9 cm, reflux symptoms and reports feeling better when taking acid suppression. The patient reports that she has some omeprazole at home and she has been told to  take the omeprazole 20 mg a day and see if it makes her feel better.  If it does not she can go up to 20 mg twice a day.  The patient will try this and let me know how it does for her.  The patient has also been told to contact me when it is time for her have a repeat MRI which was recommended due to the pancreatic cyst.  The patient has been explained the plan and agrees with it.    Lucilla Lame, MD. Marval Regal    Note: This dictation was prepared with Dragon dictation along with smaller phrase technology. Any transcriptional errors that result from this process are unintentional.

## 2020-08-28 NOTE — Telephone Encounter (Signed)
Lvm asking pt to call office. If she returns call, please transfer to me so I can investigate this further

## 2020-08-28 NOTE — Telephone Encounter (Signed)
Patient returned call. She read off the details of the appointment she was referring to. It was for the refill request from 6/22. I explained to her that this wasn't an appointment that she had missed. It is just the refill request that had been sent over electronically. I explained to her that there has been some changes to mychart to make things more transparent and to give patients access to the information in their chart. She verbalized understanding and thanked me for calling her back to explain this.

## 2020-09-13 ENCOUNTER — Other Ambulatory Visit: Payer: Self-pay | Admitting: Family Medicine

## 2020-09-13 DIAGNOSIS — M5414 Radiculopathy, thoracic region: Secondary | ICD-10-CM

## 2020-09-20 ENCOUNTER — Other Ambulatory Visit: Payer: Self-pay

## 2020-09-20 ENCOUNTER — Ambulatory Visit
Admission: RE | Admit: 2020-09-20 | Discharge: 2020-09-20 | Disposition: A | Discharge status: Home / Self Care | Payer: Medicare Other | Source: Ambulatory Visit | Attending: Family Medicine | Admitting: Family Medicine

## 2020-09-20 DIAGNOSIS — M5414 Radiculopathy, thoracic region: Secondary | ICD-10-CM | POA: Insufficient documentation

## 2020-10-23 IMAGING — MR MR LUMBAR SPINE W/O CM
5 series · 31 of 48 positions shown · non-contrast
Comparison: None.

CLINICAL DATA: Chronic low back, bilateral buttock and bilateral
lower extremity pain, right worse than. No known injury.

EXAM:
MRI LUMBAR SPINE WITHOUT CONTRAST
TECHNIQUE: Multiplanar, multisequence MR imaging of the lumbar spine was
performed. No intravenous contrast was administered.

[Series 5: T2 · sagittal · 4.0mm · 0.81mm/px · 6 of 17 slices shown (1 of 2)]
[im 1/17]
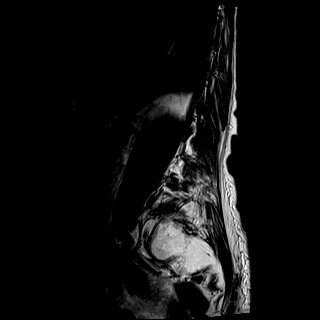
[im 4/17]
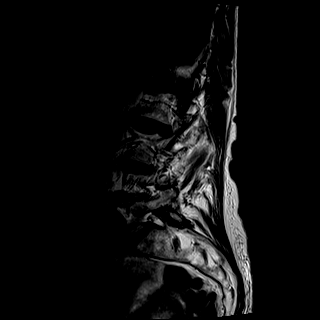
[im 7/17]
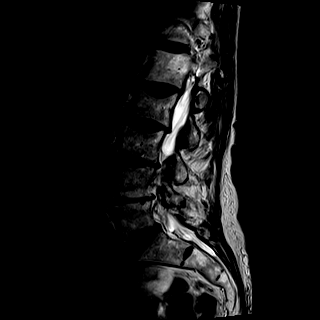
[im 10/17]
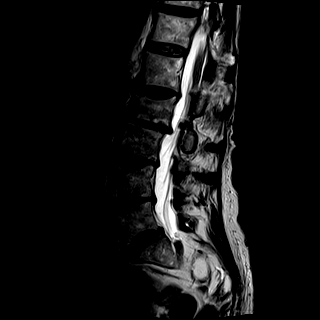
[im 13/17]
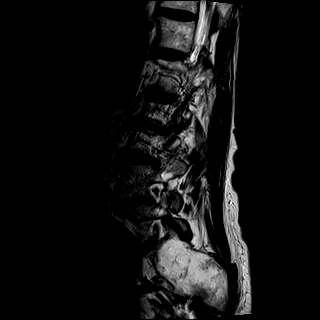
[im 17/17]
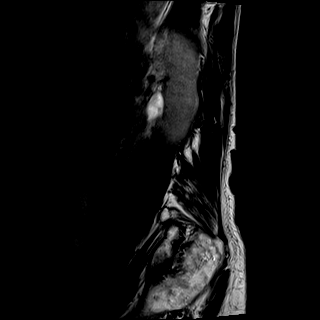

[Series 6: T1 · sagittal · 4.0mm · 0.81mm/px · 7 of 17 slices shown (1 of 2)]
[im 1/17]
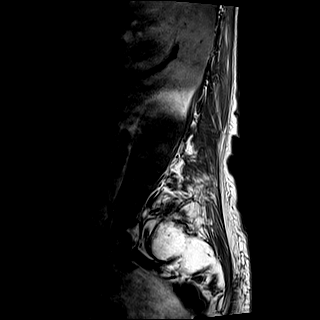
[im 3/17]
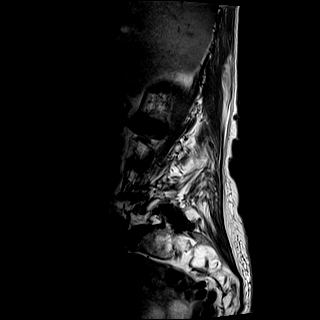
[im 6/17]
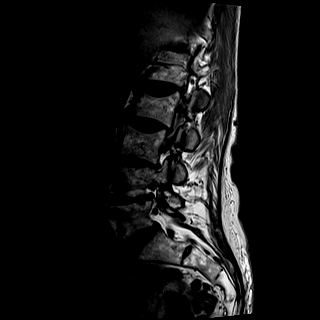
[im 9/17]
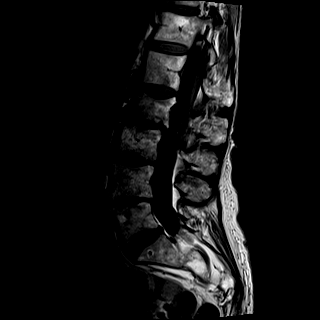
[im 11/17]
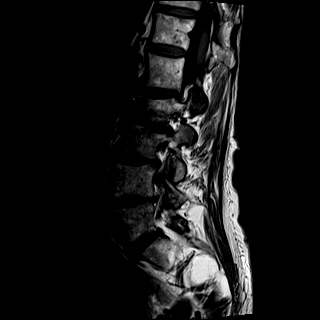
[im 14/17]
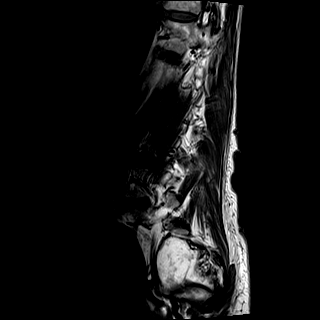
[im 17/17]
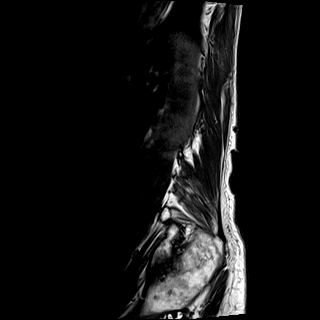

[Series 7: STIR · sagittal · 4.0mm · 0.41mm/px · 2 of 17 slices shown]
[im 1/17]
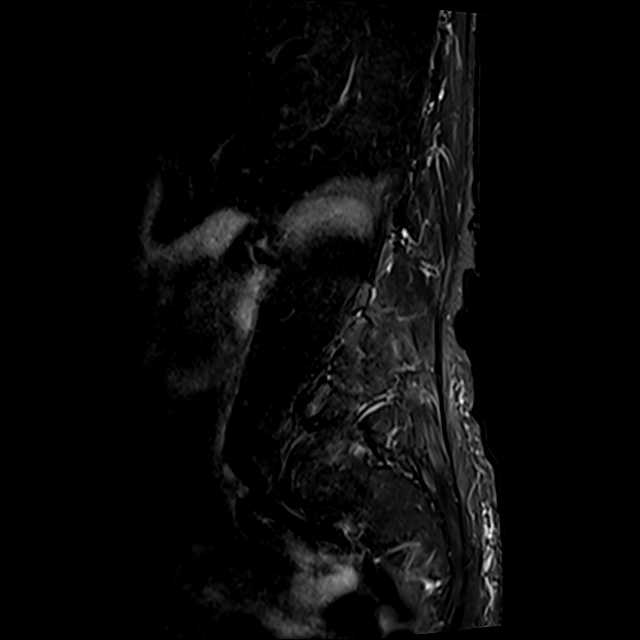
[im 3/17]
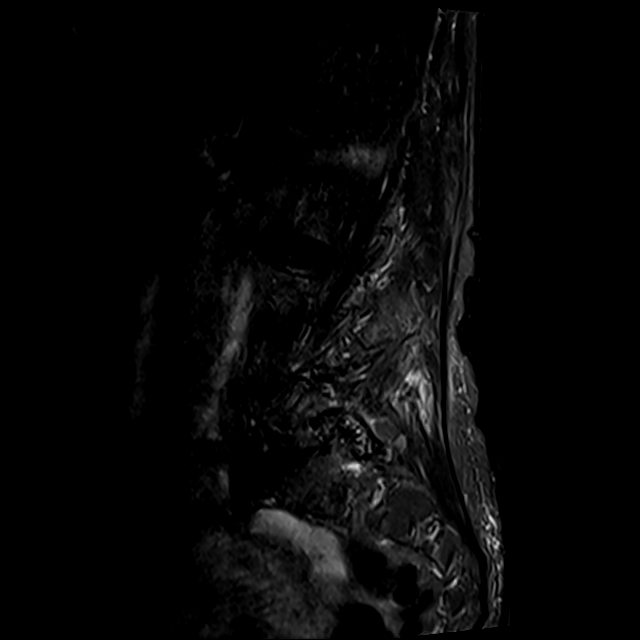

[Series 8: T2 · axial · 4.0mm · 0.78mm/px · z∈[-103,+101]mm · 8 of 34 slices shown (2 of 2)]
[im 1/34]
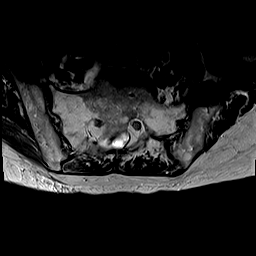
[im 6/34]
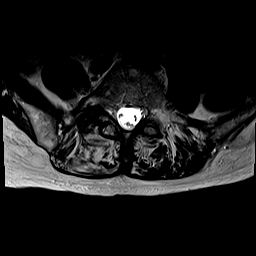
[im 11/34]
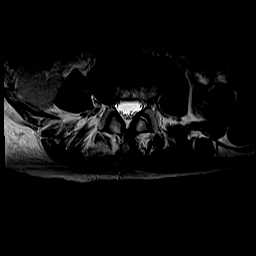
[im 16/34]
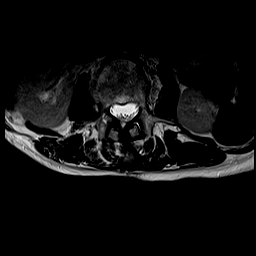
[im 18/34]
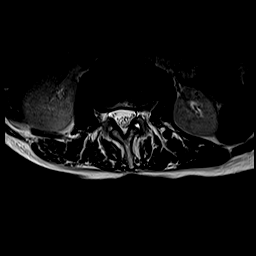
[im 23/34]
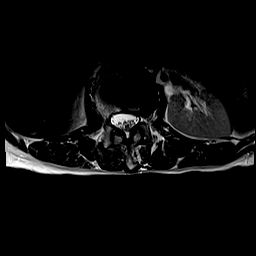
[im 28/34]
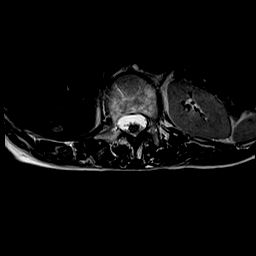
[im 34/34]
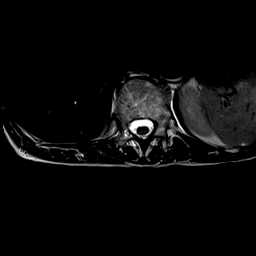

[Series 9: T1 · axial · 4.0mm · 0.39mm/px · z∈[-103,+101]mm · 8 of 34 slices shown (2 of 2)]
[im 1/34]
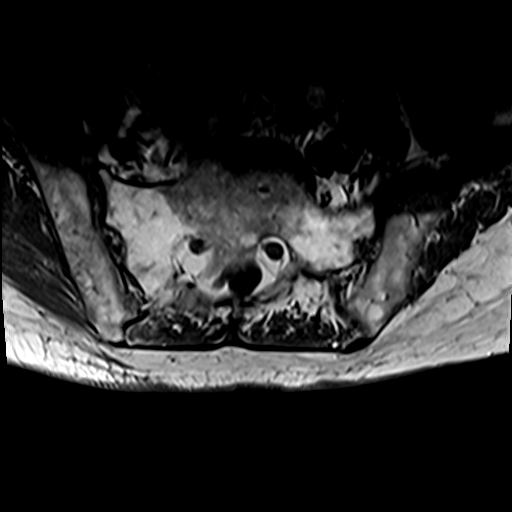
[im 6/34]
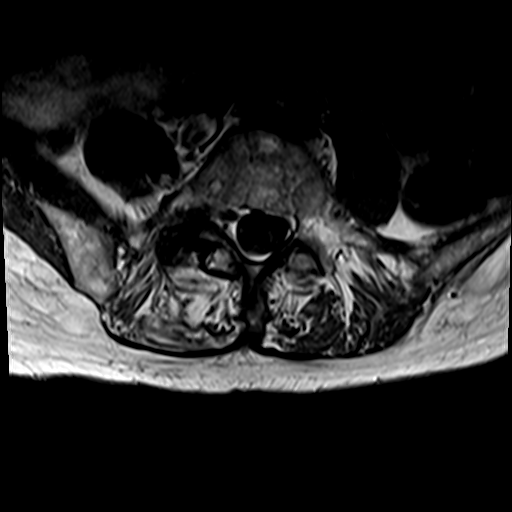
[im 11/34]
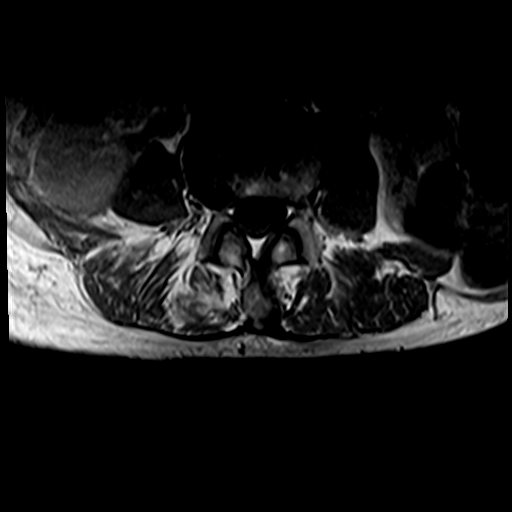
[im 16/34]
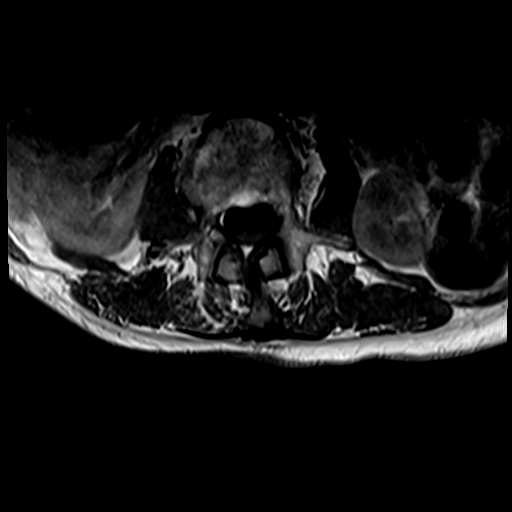
[im 18/34]
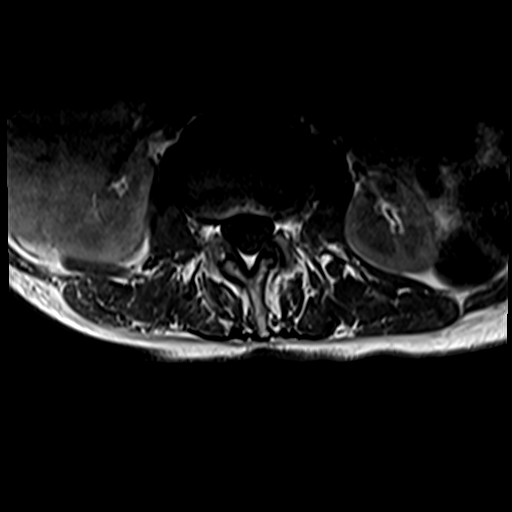
[im 23/34]
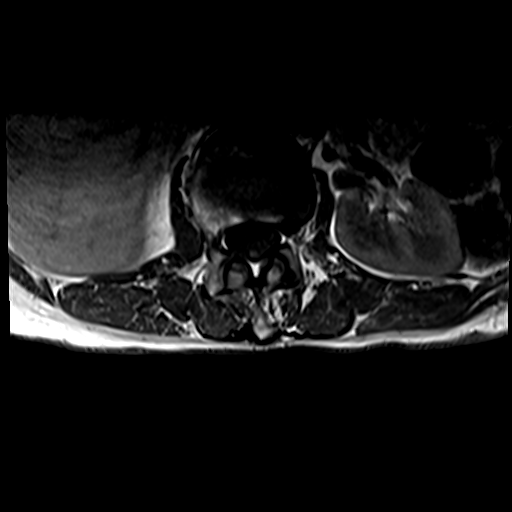
[im 28/34]
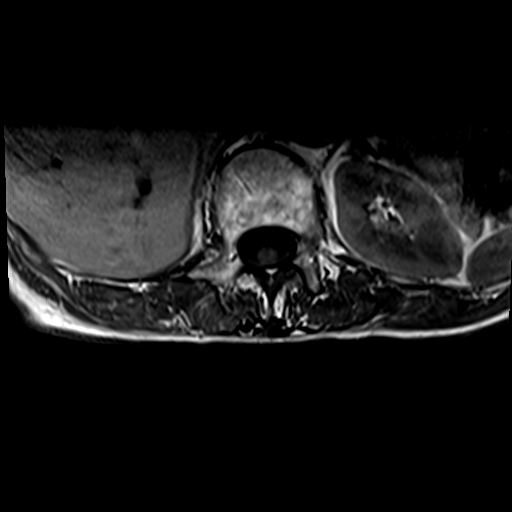
[im 34/34]
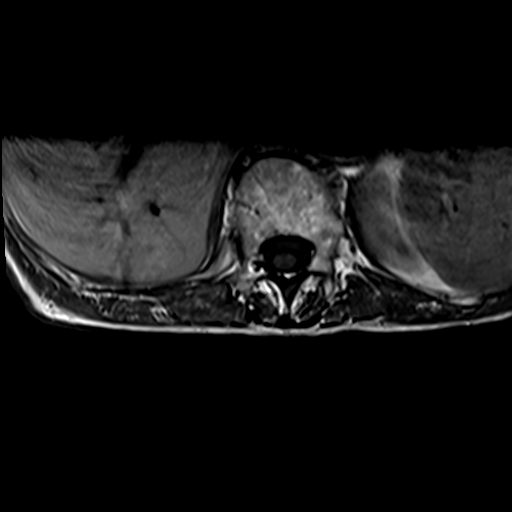

[31 of 48 positions shown; findings below may reference images not displayed]

FINDINGS: Segmentation:  Standard.

Alignment:  0.3 cm retrolisthesis L2 on L3 noted.

Vertebrae: No acute fracture, evidence of discitis, or bone lesion.
Mild, remote superior endplate compression fracture L2 noted.

Conus medullaris and cauda equina: Conus extends to the L1-2 level.
Conus and cauda equina appear normal.

Paraspinal and other soft tissues: Negative.

Disc levels:

T11-12 is imaged in the sagittal plane only and negative.

T12-L1: Negative.

L1-2: Negative.

L2-3: Loss of disc space height with a shallow bulge and endplate
spur which is more prominent to the right. There is mild narrowing
in the left subarticular recess and foramen. The right foramen is
open.

L3-4: Shallow disc bulge without stenosis.

L4-5: Loss of disc space height with a shallow bulge and mild facet
degenerative change. The central canal and left foramen are open.
Mild to moderate right foraminal narrowing noted.

L5-S1: Shallow central protrusion and mild-to-moderate facet
arthropathy, worse on the right. Minimal edema is seen in the right
facets. There is no stenosis.
IMPRESSION: 1. Mild narrowing in the left subarticular recess and foramen at
L2-3 due to a shallow bulge and endplate spur.
2. Mild to moderate right foraminal narrowing L4-5 due to disc and
facet arthropathy.
3. Scattered facet degenerative change is most notable on the right
at L5-S1 where there is very mild edema in the facets.

## 2020-11-11 NOTE — Progress Notes (Signed)
Cardiology Office Note  Date:  11/12/2020   ID:  Robyn Washington, DOB 16-Dec-1934, MRN [id number]  PCP:  Venia Carbon, MD   Chief Complaint  Patient presents with   6 month follow up     Patient c/o shortness of breath with walking. Medications reviewed by the patient verbally.     HPI:  Robyn Washington is a 86 y.o. female with a history of  SVT,  mild aortic insufficiency, chronic venous insufficiency  Osteoporosis,back issues [place] esoph who presents for follow-up of her near syncope, syncope  Last seen in clinic March 2022 On that visit, BP low in the Am,  SOB in the Am, walking  Weight down, down 5 pounds Likes bread  Exercises, rows/treadmill/bike, daily SOB getting out of bed, but not with exercise Some fatigue during the day unrelated to her activities, but sleeping well  episode of near syncope last august 2021  EKG personally reviewed by myself on todays visit Normal sinus rhythm rate 60 bpm no significant ST-T wave changes  Of past medical history reviewed got out of bed when she felt very lightheaded and weak.  She had to sit back down.   nauseated and noticed her heart racing. She put her head between her knees. She was drenched in perspiration but did not lose consciousness.  She walked to the bathroom and felt like she was pale when looking at herself in the mirror. She cannot identify any triggers. She slept okay the night before.  brief palpitations  prior cardiology evaluation at [facility] Did have 8 episodes of SVT--and occasional palpitations Did try vagal maneuver for this (with bad spell with diaphoresis) No chest pain or SOB  Echo 1. The left ventricle is normal in size with normal wall thickness.  2. The left ventricular systolic function is normal, LVEF is visually estimated at > 55%.  3. The aortic valve is trileaflet with mildly thickened leaflets with normal excursion.  4. There is mild to moderate aortic regurgitation.  5. The  right ventricle is normal in size, with normal systolic function.  6. There is mild to moderate tricuspid regurgitation.  7. IVC size and inspiratory change suggest mildly elevated right atrial pressure. (5-10 mmHg).   PMH:   has a past medical history of Basal cell carcinoma (2014), Diverticulitis, Hypothyroidism, IBS (irritable bowel syndrome), Lumbar degenerative disc disease, Mood disorder ([place]) (2011), Osteopenia, Raynaud's syndrome, and Rosacea.  PSH:    Past Surgical History:  Procedure Laterality Date   MOHS SURGERY  2017   [facility]?   PILONIDAL CYST EXCISION  1958   TONSILLECTOMY AND ADENOIDECTOMY      Current Outpatient Medications  Medication Sig Dispense Refill   Calcium Carbonate-Vit D-Min (CALCIUM 600+D PLUS MINERALS) 600-400 MG-UNIT TABS Take by mouth.     denosumab (PROLIA) 60 MG/ML SOSY injection Inject 60 mg into the skin every 6 (six) months.     DULoxetine (CYMBALTA) 60 MG capsule TAKE 1 CAPSULE BY MOUTH  DAILY 90 capsule 3   fexofenadine (ALLEGRA) 180 MG tablet Take by mouth.     levothyroxine (SYNTHROID) 75 MCG tablet TAKE 1 TABLET BY MOUTH  DAILY 90 tablet 3   Multiple Vitamins-Minerals (CENTRUM SILVER) tablet Take by mouth.     timolol (TIMOPTIC) 0.5 % ophthalmic solution      triamcinolone cream (KENALOG) 0.1 % Apply to itchy rash 1-2 times daily until improved. Avoid face, groin, underarms. 240 g 0   doxycycline (ADOXA) 100 MG tablet Take 1 tablet (  100 mg total) by mouth daily as needed (for rosacea flares). (Patient not taking: Reported on 11/12/2020) 30 tablet 5   famotidine (PEPCID) 20 MG tablet Take 20 mg by mouth 2 (two) times daily as needed for heartburn or indigestion. (Patient not taking: No sig reported)     omeprazole (PRILOSEC) 20 MG capsule Take 20 mg by mouth daily as needed. (Patient not taking: No sig reported)     No current facility-administered medications for this visit.     Allergies:   Bioflavonoids, Banana, Barley grass, Pear, Pineapple,  Rice, Rye grass flower pollen extract  [gramineae pollens], Wheat bran, and Hydroxychloroquine   Social History:  The patient  reports that she has never smoked. She has never used smokeless tobacco. She reports that she does not drink alcohol and does not use drugs.   Family History:   family history includes Alcohol abuse in her father; Diabetes in her mother and sister; Thyroid cancer in her daughter.    Review of Systems: Review of Systems  Constitutional:  Positive for malaise/fatigue.  HENT: Negative.    Respiratory: Negative.    Cardiovascular: Negative.   Gastrointestinal: Negative.   Musculoskeletal: Negative.   Neurological: Negative.   Psychiatric/Behavioral: Negative.    All other systems reviewed and are negative.   PHYSICAL EXAM: VS:  BP 140/86 (BP Location: Left Arm, Patient Position: Sitting, Cuff Size: Normal)   Pulse 60   Ht 5\' 5"  (1.651 m)   Wt 105 lb 6 oz (47.8 kg)   SpO2 98%   BMI 17.54 kg/m  , BMI Body mass index is 17.54 kg/m. Constitutional:  oriented to person, place, and time. No distress.  Thin HENT:  Head: Grossly normal Eyes:  no discharge. No scleral icterus.  Neck: No JVD, no carotid bruits  Cardiovascular: Regular rate and rhythm, no murmurs appreciated Pulmonary/Chest: Clear to auscultation bilaterally, no wheezes or rails Abdominal: Soft.  no distension.  no tenderness.  Musculoskeletal: Normal range of motion Neurological:  normal muscle tone. Coordination normal. No atrophy Skin: Skin warm and dry Psychiatric: normal affect, pleasant   Recent Labs: 02/21/2020: ALT 24; BUN 19; Creatinine, Ser 0.67; Hemoglobin 13.3; Platelets 210.0; Potassium 4.5; Sodium 141; TSH 1.18    Lipid Panel No results found for: CHOL, HDL, LDLCALC, TRIG    Wt Readings from Last 3 Encounters:  11/12/20 105 lb 6 oz (47.8 kg)  08/27/20 106 lb 6.4 oz (48.3 kg)  05/13/20 110 lb (49.9 kg)       ASSESSMENT AND PLAN:  Problem List Items Addressed This  Visit       Cardiology Problems   SVT (supraventricular tachycardia) (HCC) - Primary   Raynaud's syndrome     Other   Malnutrition of mild degree ([place])   Other Visit Diagnoses     Near syncope         1. Near syncope/ Stable symptoms, Recommend she avoid weight loss, continue her high salt diet, fluid loading Unable to exclude POTS given some shortness of breath, tachycardia on exertion after getting up No issues with exercise Discussed propranolol/metoprolol, she prefers to hold off at this time Discussed a ZIO monitor if symptoms get worse  2. SVT (supraventricular tachycardia)  results reviewed, Zio patch monitor 4/11 showing 2.1% PVC burden as well as 8 episodes of narrow complex tachycardia.  Patient reports occasional palpitations, worse on walking around the house, not on working out at the gym  3. Aortic valve insufficiency Mild to moderate on echo Minimal  murmur on exam, no further work-up ordered  4. HTN Weight stable Recommend hydration, salt  5.  Fatigue Unrelated to working out at [employer] Lab work today B12, vitamin D, TSH   Total encounter time more than 25 minutes  Greater than 50% was spent in counseling and coordination of care with the patient    Signed, Esmond Plants, M.D., Ph.D. [place], [place]

## 2020-11-12 ENCOUNTER — Encounter: Payer: Self-pay | Admitting: Cardiovascular Disease

## 2020-11-12 ENCOUNTER — Ambulatory Visit (INDEPENDENT_AMBULATORY_CARE_PROVIDER_SITE_OTHER): Payer: Medicare Other | Admitting: Cardiovascular Disease

## 2020-11-12 ENCOUNTER — Other Ambulatory Visit: Payer: Self-pay

## 2020-11-12 VITALS — BP 140/86 | HR 60 | Ht 65.0 in | Wt 105.4 lb

## 2020-11-12 DIAGNOSIS — R55 Syncope and collapse: Secondary | ICD-10-CM | POA: Diagnosis not present

## 2020-11-12 DIAGNOSIS — Z79899 Other long term (current) drug therapy: Secondary | ICD-10-CM

## 2020-11-12 DIAGNOSIS — E441 Mild protein-calorie malnutrition: Secondary | ICD-10-CM

## 2020-11-12 DIAGNOSIS — I471 Supraventricular tachycardia: Secondary | ICD-10-CM | POA: Diagnosis not present

## 2020-11-12 DIAGNOSIS — E559 Vitamin D deficiency, unspecified: Secondary | ICD-10-CM

## 2020-11-12 DIAGNOSIS — Z13220 Encounter for screening for lipoid disorders: Secondary | ICD-10-CM

## 2020-11-12 DIAGNOSIS — E038 Other specified hypothyroidism: Secondary | ICD-10-CM

## 2020-11-12 DIAGNOSIS — I7300 Raynaud's syndrome without gangrene: Secondary | ICD-10-CM

## 2020-11-12 NOTE — Patient Instructions (Addendum)
Call if you would like a ZIO monitor  Medication Instructions:  No changes  Call if you would like propranolol or metoprolol as needed for palpitations  If you need a refill on your cardiac medications before your next appointment, please call your pharmacy.    Lab work: Lipid panel, Vit D, B12, TSH Results will post to your MyChart, we will call with ABNORMAL results   Testing/Procedures: No new testing needed  Follow-Up: At [facility], you and your health needs are our priority.  As part of our continuing mission to provide you with exceptional heart care, we have created designated Provider Care Teams.  These Care Teams include your primary Cardiologist (physician) and Advanced Practice Providers (APPs -  Physician Assistants and Nurse Practitioners) who all work together to provide you with the care you need, when you need it.  You will need a follow up appointment in 12 months  Providers on your designated Care Team:   Murray Hodgkins, NP Christell Faith, PA-C Marrianne Mood, PA-C Cadence [place], [place]  COVID-19 Vaccine Information can be found at: [email or web address].[email or web address] For questions related to vaccine distribution or appointments, please email [email or web address] or call [phone].

## 2020-11-13 ENCOUNTER — Ambulatory Visit: Payer: Medicare Other | Admitting: Cardiovascular Disease

## 2020-11-13 LAB — LIPID PANEL
Chol/HDL Ratio: 2.4 ratio (ref 0.0–4.4)
Cholesterol, Total: 187 mg/dL (ref 100–199)
HDL: 78 mg/dL (ref >39)
LDL Chol Calc (NIH): 94 mg/dL (ref 0–99)
Triglycerides: 86 mg/dL (ref 0–149)
VLDL Cholesterol Cal: 15 mg/dL (ref 5–40)

## 2020-11-13 LAB — VITAMIN B12: Vitamin B-12: 1197 pg/mL (ref 232–1245)

## 2020-11-13 LAB — TSH: TSH: 1.05 u[IU]/mL (ref 0.450–4.500)

## 2020-11-13 LAB — VITAMIN D 25 HYDROXY (VIT D DEFICIENCY, FRACTURES): Vit D, 25-Hydroxy: 73.5 ng/mL (ref 30.0–100.0)

## 2021-02-06 ENCOUNTER — Other Ambulatory Visit: Payer: Self-pay

## 2021-02-06 MED ORDER — OMEPRAZOLE 20 MG PO CPDR: 20.0000 mg | DELAYED_RELEASE_CAPSULE | Freq: Every day | ORAL | 3 refills | Status: DC | PRN

## 2021-02-25 ENCOUNTER — Encounter: Payer: Self-pay | Admitting: Internal Medicine

## 2021-02-25 ENCOUNTER — Other Ambulatory Visit: Payer: Self-pay

## 2021-02-25 ENCOUNTER — Ambulatory Visit (INDEPENDENT_AMBULATORY_CARE_PROVIDER_SITE_OTHER): Payer: Medicare Other | Admitting: Internal Medicine

## 2021-02-25 VITALS — BP 108/78 | HR 64 | Temp 97.5°F | Ht 65.0 in | Wt 108.0 lb

## 2021-02-25 DIAGNOSIS — M818 Other osteoporosis without current pathological fracture: Secondary | ICD-10-CM | POA: Diagnosis not present

## 2021-02-25 DIAGNOSIS — E441 Mild protein-calorie malnutrition: Secondary | ICD-10-CM

## 2021-02-25 DIAGNOSIS — I471 Supraventricular tachycardia: Secondary | ICD-10-CM

## 2021-02-25 DIAGNOSIS — Z0000 Encounter for general adult medical examination without abnormal findings: Secondary | ICD-10-CM | POA: Diagnosis not present

## 2021-02-25 DIAGNOSIS — E039 Hypothyroidism, unspecified: Secondary | ICD-10-CM | POA: Diagnosis not present

## 2021-02-25 DIAGNOSIS — F39 Unspecified mood [affective] disorder: Secondary | ICD-10-CM

## 2021-02-25 LAB — COMPREHENSIVE METABOLIC PANEL
ALT: 29 U/L (ref 0–35)
AST: 34 U/L (ref 0–37)
Albumin: 4.5 g/dL (ref 3.5–5.2)
Alkaline Phosphatase: 55 U/L (ref 39–117)
BUN: 19 mg/dL (ref 6–23)
CO2: 31 mEq/L (ref 19–32)
Calcium: 9.5 mg/dL (ref 8.4–10.5)
Chloride: 100 mEq/L (ref 96–112)
Creatinine, Ser: 0.62 mg/dL (ref 0.40–1.20)
GFR: 81.83 mL/min (ref >60.00)
Glucose, Bld: 84 mg/dL (ref 70–99)
Potassium: 4 mEq/L (ref 3.5–5.1)
Sodium: 140 mEq/L (ref 135–145)
Total Bilirubin: 0.5 mg/dL (ref 0.2–1.2)
Total Protein: 6.9 g/dL (ref 6.0–8.3)

## 2021-02-25 LAB — CBC
HCT: 40.1 % (ref 36.0–46.0)
Hemoglobin: 13 g/dL (ref 12.0–15.0)
MCHC: 32.5 g/dL (ref 30.0–36.0)
MCV: 100.8 fl — ABNORMAL HIGH (ref 78.0–100.0)
Platelets: 189 10*3/uL (ref 150.0–400.0)
RBC: 3.98 Mil/uL (ref 3.87–5.11)
RDW: 12.9 % (ref 11.5–15.5)
WBC: 6.2 10*3/uL (ref 4.0–10.5)

## 2021-02-25 LAB — VITAMIN D 25 HYDROXY (VIT D DEFICIENCY, FRACTURES): VITD: 48.08 ng/mL (ref 30.00–100.00)

## 2021-02-25 LAB — TSH: TSH: 0.96 u[IU]/mL (ref 0.35–5.50)

## 2021-02-25 LAB — T4, FREE: Free T4: 0.96 ng/dL (ref 0.60–1.60)

## 2021-02-25 NOTE — Assessment & Plan Note (Signed)
Now on prolia

## 2021-02-25 NOTE — Assessment & Plan Note (Signed)
Will recheck labs on the levothyroxine

## 2021-02-25 NOTE — Assessment & Plan Note (Signed)
Still underweight but has been stable now for some time No action

## 2021-02-25 NOTE — Assessment & Plan Note (Signed)
Chronic dysthymia---some better on the duloxetine (though she still feels somewhat flat) Will continue

## 2021-02-25 NOTE — Progress Notes (Signed)
Hearing Screening - Comments:: Passed whisper test Vision Screening - Comments:: December 2022  

## 2021-02-25 NOTE — Assessment & Plan Note (Signed)
I have personally reviewed the Medicare Annual Wellness questionnaire and have noted 1. The patient's medical and social history 2. Their use of alcohol, tobacco or illicit drugs 3. Their current medications and supplements 4. The patient's functional ability including ADL's, fall risks, home safety risks and hearing or visual             impairment. 5. Diet and physical activities 6. Evidence for depression or mood disorders  The patients weight, height, BMI and visual acuity have been recorded in the chart I have made referrals, counseling and provided education to the patient based review of the above and I have provided the pt with a written personalized care plan for preventive services.  I have provided you with a copy of your personalized plan for preventive services. Please take the time to review along with your updated medication list.  Has had bivalent COVID and flu vaccine Is getting 2nd shingrix today No cancer screening due to age Exercises regularly

## 2021-02-25 NOTE — Assessment & Plan Note (Signed)
Still with regular symptoms Getting zio monitor soon

## 2021-02-25 NOTE — Progress Notes (Signed)
Subjective:    Patient ID: Robyn Washington, female    DOB: 04/23/1934, 87 y.o.   MRN: [id number]  HPI Here for Medicare wellness visit and follow up of chronic health conditions Reviewed advanced directives Reviewed other doctors--Dr Robyn Washington--rheumatologist, Dr Robyn Washington--dermatologist, Dr Robyn Washington--cardiologist, Dr Robyn Washington, Dr Robyn Washington, Dr Robyn Washington, Dr Robyn Washington, Dr Robyn Washington No hospitalizations or surgery No tobacco or alcohol Vision and hearing are okay Exercises regularly No falls Ongoing mild mood issues Independent with instrumental ADLs No sig memory issues  Is on prolia now---given through Dr Robyn Washington, the rheumatologist Was on fosamax for 10 years! Is on vitamin D and lots of dairy  She feels she has reached plateau on the duloxetine Doesn't have the lows now  Works out daily ---has good relationship with Robyn Washington, the fitness director Not depressed but not really happy "I have had some challenges in the past year" Oldest Robyn moved to [facility] has had to adjust to that Younger Robyn in [place]  Did see Dr Robyn Washington He has recommended zio patch to document how much SVT she is having "Comes in stages"----nausea, then hot flashes/perspiration and weakness She will sit and it will pass Not that frequent though  Weight is stable Known thoracic spine issues--working on posture, etc  Ongoing GI issues Continues on meds Plans to go the Robyn Washington for allergy testing--thinks there is some food issue Ongoing bloating and pain  Current Outpatient Medications on File Prior to Visit  Medication Sig Dispense Refill   Calcium Carbonate-Vit D-Min (CALCIUM 600+D PLUS MINERALS) 600-400 MG-UNIT TABS Take by mouth.     denosumab (PROLIA) 60 MG/ML SOSY injection Inject 60 mg into the skin every 6 (six) months.     doxycycline (ADOXA) 100 MG tablet Take 1 tablet (100 mg total) by mouth daily as needed (for rosacea flares). 30  tablet 5   DULoxetine (CYMBALTA) 60 MG capsule TAKE 1 CAPSULE BY MOUTH  DAILY 90 capsule 3   famotidine (PEPCID) 20 MG tablet Take 20 mg by mouth 2 (two) times daily as needed for heartburn or indigestion.     fexofenadine (ALLEGRA) 180 MG tablet Take by mouth.     levothyroxine (SYNTHROID) 75 MCG tablet TAKE 1 TABLET BY MOUTH  DAILY 90 tablet 3   Multiple Vitamins-Minerals (CENTRUM SILVER) tablet Take by mouth.     omeprazole (PRILOSEC) 20 MG capsule Take 1 capsule (20 mg total) by mouth daily as needed. 90 capsule 3   timolol (TIMOPTIC) 0.5 % ophthalmic solution      triamcinolone cream (KENALOG) 0.1 % Apply to itchy rash 1-2 times daily until improved. Avoid face, groin, underarms. 240 g 0   No current facility-administered medications on file prior to visit.    Allergies  Allergen Reactions   Bioflavonoids Swelling    Swelling of lips Swelling of lips    Banana    Barley Grass Other (See Comments)    Other reaction(s): Other (See Comments)   Pear Other (See Comments)    Other reaction(s): Other (See Comments) Feels bad    Pineapple    Rice Other (See Comments)    Rhinitis. Rhinitis.    Rye Grass Flower Pollen Extract  [Gramineae Pollens] Other (See Comments)   Wheat Bran Other (See Comments)   Hydroxychloroquine Rash    Past Medical History:  Diagnosis Date   Basal cell carcinoma 2014   Right Forehead, MOHS   Diverticulitis    hospitalized 2013   Hypothyroidism    IBS (irritable bowel syndrome)  Lumbar degenerative disc disease    Mood disorder ([place]yn Washington) 2011   adjustment issues mostly to moving and husband's illness   Osteopenia    Raynaud's syndrome    Rosacea     Past Surgical History:  Procedure Laterality Date   MOHS SURGERY  2017   [facility]?   PILONIDAL CYST EXCISION  1958   TONSILLECTOMY AND ADENOIDECTOMY      Family History  Problem Relation Age of Onset   Diabetes Mother    Alcohol abuse Father    Diabetes Sister    Thyroid cancer Robyn      Social History   Socioeconomic History   Marital status: Widowed    Spouse name: Not on file   Number of children: 2   Years of education: Not on file   Highest education level: Not on file  Occupational History   Occupation: Web designer    Comment: Manufacturing systems engineer  Tobacco Use   Smoking status: Never   Smokeless tobacco: Never  Vaping Use   Vaping Use: Never used  Substance and Sexual Activity   Alcohol use: Never   Drug use: Never   Sexual activity: Not on file  Other Topics Concern   Not on file  Social History Narrative   2 daughters      Has living will   Robyn Washington) then [employer] ([place]) have health care POA   Would accept resuscitation but no prolonged ventilation or tube feeds   Social Determinants of Health   Financial Resource Strain: Not on file  Food Insecurity: Not on file  Transportation Needs: Not on file  Physical Activity: Not on file  Stress: Not on file  Social Connections: Not on file  Intimate Partner Violence: Not on file   Review of Systems Appetite is fine Sleeps fair--naps some Teeth are okay--keeps up with dentist Wears seat belt No dysuria or hematuria No chest pain No SOB---exercise tolerance is stable No suspicious skin lesions    Objective:   Physical Exam Constitutional:      Appearance: Normal appearance.  HENT:     Mouth/Throat:     Comments: No lesions Eyes:     Conjunctiva/sclera: Conjunctivae normal.     Pupils: Pupils are equal, round, and reactive to light.  Cardiovascular:     Rate and Rhythm: Normal rate and regular rhythm.     Pulses: Normal pulses.     Heart sounds: No murmur heard.   No gallop.  Pulmonary:     Effort: Pulmonary effort is normal.     Breath sounds: Normal breath sounds. No wheezing or rales.  Abdominal:     Palpations: Abdomen is soft.     Tenderness: There is no abdominal tenderness.  Musculoskeletal:     Cervical back: Neck supple.     Left  lower leg: No edema.  Lymphadenopathy:     Cervical: No cervical adenopathy.  Skin:    Findings: No lesion.     Comments: Dry skin on hands and feet  Neurological:     Mental Status: She is alert and oriented to person, place, and time.     Comments: Mini Cog normal  Psychiatric:        Mood and Affect: Mood normal.        Behavior: Behavior normal.           Assessment & Plan:

## 2021-03-26 ENCOUNTER — Telehealth: Payer: Self-pay | Admitting: Cardiovascular Disease

## 2021-03-26 NOTE — Telephone Encounter (Signed)
Patient is now requesting a ZIO monitor, please assist

## 2021-03-26 NOTE — Telephone Encounter (Signed)
Able to return call to Robyn Washington, she called in wanting to discuss need for a ZIO as she had formally discuss with Dr. Rockey Situ at last [place] 11/12/2020. She is reporting increase SOBOE, feeling more fatigue with simple activities. Reports chronic hx of hypotension, unchange at this time, no numbers to report, last PCP OV 02/25/2021, BP 108/78 HR 64 with Dr. Silvio Pate.   Pt reports no known palpitations or irregular HR, reports hx of SVT. Advised based on her last OV notes, she was seen for SOBOE & malaise/fatigue. Dr. Donivan Scull notes from 11/12/20 Patient presents with   6 month follow up       Patient c/o shortness of breath with walking. Medications reviewed by the patient verbally.     Review of Systems: Review of Systems  Constitutional:  Positive for malaise/fatigue.   Other Visit Diagnoses       Near syncope           1. Near syncope/ Stable symptoms, Recommend she avoid weight loss, continue her high salt diet, fluid loading Unable to exclude POTS given some shortness of breath, tachycardia on exertion after getting up No issues with exercise Discussed propranolol/metoprolol, she prefers to hold off at this time Discussed a ZIO monitor if symptoms get worse  2. SVT (supraventricular tachycardia)  results reviewed, Zio patch monitor 4/11 showing 2.1% PVC burden as well as 8 episodes of narrow complex tachycardia.  Patient reports occasional palpitations, worse on walking around the house, not on working out at the gym  At this time, do not see need for a ZIO to be order, has not had any more episodes of syncope (near) and not experiencing irregular heart rates. Would be best to come in to be seen as she for re-eval, possible discussion for ECHO, last ECHO was [facility] in 2021. Robyn Washington agrees with scheduling an appt\, no need for a Zio at this time.   Would only prefer to see Dr. Rockey Situ, appt made for 3/1 Wed, at 09:20 am. Also added to waitlist per request. Otherwise all questions were  address and no additional concerns at this time. Robyn Washington thankful for the return call and advice, will call back for anything further.

## 2021-04-02 ENCOUNTER — Telehealth: Payer: Self-pay

## 2021-04-02 NOTE — Telephone Encounter (Signed)
Spoke with patient this morning. She does NOT want RFs through [facility], ONLY through [facility]. Paper fax for RFs were sent in yesterday. aw

## 2021-04-02 NOTE — Telephone Encounter (Signed)
Patient called asking for RFs of Rosacea-Oxymetazoline. RF sent in and patient advised.

## 2021-04-03 ENCOUNTER — Encounter: Payer: Self-pay | Admitting: Internal Medicine

## 2021-04-03 MED ORDER — DULOXETINE HCL 30 MG PO CPEP: 30.0000 mg | ORAL_CAPSULE | Freq: Every day | ORAL | 3 refills | Status: DC

## 2021-04-08 ENCOUNTER — Ambulatory Visit (INDEPENDENT_AMBULATORY_CARE_PROVIDER_SITE_OTHER): Payer: Medicare Other | Admitting: Podiatry

## 2021-04-08 ENCOUNTER — Other Ambulatory Visit: Payer: Self-pay

## 2021-04-08 ENCOUNTER — Ambulatory Visit (INDEPENDENT_AMBULATORY_CARE_PROVIDER_SITE_OTHER): Payer: Medicare Other

## 2021-04-08 ENCOUNTER — Encounter: Payer: Self-pay | Admitting: Podiatry

## 2021-04-08 DIAGNOSIS — G5762 Lesion of plantar nerve, left lower limb: Secondary | ICD-10-CM | POA: Diagnosis not present

## 2021-04-08 NOTE — Progress Notes (Signed)
° °  HPI: 87 y.o. female presenting today for evaluation of bilateral generalized foot pain.  She denies a history of injury.  She says she wears Rolena Infante and new balance running shoes with compression hose.  She would like to have her feet evaluated today.  Past Medical History:  Diagnosis Date   Basal cell carcinoma 2014   Right Forehead, MOHS   Diverticulitis    hospitalized 2013   Hypothyroidism    IBS (irritable bowel syndrome)    Lumbar degenerative disc disease    Mood disorder ([place]) 2011   adjustment issues mostly to moving and husband's illness   Osteopenia    Raynaud's syndrome    Rosacea     Past Surgical History:  Procedure Laterality Date   MOHS SURGERY  2017   [facility]?   PILONIDAL CYST EXCISION  1958   TONSILLECTOMY AND ADENOIDECTOMY      Allergies  Allergen Reactions   Bioflavonoids Swelling    Swelling of lips Swelling of lips    Banana    Barley Grass Other (See Comments)    Other reaction(s): Other (See Comments)   Pear Other (See Comments)    Other reaction(s): Other (See Comments) Feels bad    Pineapple    Rice Other (See Comments)    Rhinitis. Rhinitis.    Rye Grass Flower Pollen Extract  [Gramineae Pollens] Other (See Comments)   Wheat Bran Other (See Comments)   Hydroxychloroquine Rash     Physical Exam: General: The patient is alert and oriented x3 in no acute distress.  Dermatology: Skin is warm, dry and supple bilateral lower extremities. Negative for open lesions or macerations.  Discolored toenail noted to the left second toe secondary to prior history of injury which caused bleeding within the nail plate.  Vascular: Palpable pedal pulses bilaterally. Capillary refill within normal limits.  Negative for any significant edema or erythema  Neurological: Light touch and protective threshold grossly intact  Musculoskeletal Exam: No pedal deformities noted.  There is some tenderness palpation throughout the left forefoot as well as generalized  bilateral feet and ankles.  This is very mild  Radiographic Exam:  Diffuse degenerative changes noted throughout the pedal joints of the foot consistent with arthritis.  No fractures identified.  Assessment: 1.  Generalized foot pain bilateral 2.  History of sciatica affecting the bilateral lower extremities   Plan of Care:  1. Patient evaluated. X-Rays reviewed.  2.  Continue wearing good supportive shoes that support the arches of the feet 3.  Continue compression hose daily 4.  I explained to the patient that the discoloration of the nail plate should resolve over time.   5.  Return to clinic as needed      Edrick Kins, DPM [facility]  Dr. Edrick Kins, DPM    [address], [place] [address]                Office [phone]  Fax [phone]

## 2021-04-09 ENCOUNTER — Ambulatory Visit (INDEPENDENT_AMBULATORY_CARE_PROVIDER_SITE_OTHER): Payer: Medicare Other | Admitting: Dermatology

## 2021-04-09 DIAGNOSIS — L720 Epidermal cyst: Secondary | ICD-10-CM | POA: Diagnosis not present

## 2021-04-09 DIAGNOSIS — L820 Inflamed seborrheic keratosis: Secondary | ICD-10-CM | POA: Diagnosis not present

## 2021-04-09 DIAGNOSIS — L719 Rosacea, unspecified: Secondary | ICD-10-CM | POA: Diagnosis not present

## 2021-04-09 NOTE — Patient Instructions (Signed)
Seborrheic Keratosis ? ?What causes seborrheic keratoses? ?Seborrheic keratoses are harmless, common skin growths that first appear during adult life.  As time goes by, more growths appear.  Some people may develop a large number of them.  Seborrheic keratoses appear on both covered and uncovered body parts.  They are not caused by sunlight.  The tendency to develop seborrheic keratoses can be inherited.  They vary in color from skin-colored to gray, brown, or even black.  They can be either smooth or have a rough, warty surface.   ?Seborrheic keratoses are superficial and look as if they were stuck on the skin.  Under the microscope this type of keratosis looks like layers upon layers of skin.  That is why at times the top layer may seem to fall off, but the rest of the growth remains and re-grows.   ? ?Treatment ?Seborrheic keratoses do not need to be treated, but can easily be removed in the office.  Seborrheic keratoses often cause symptoms when they rub on clothing or jewelry.  Lesions can be in the way of shaving.  If they become inflamed, they can cause itching, soreness, or burning.  Removal of a seborrheic keratosis can be accomplished by freezing, burning, or surgery. ?If any spot bleeds, scabs, or grows rapidly, please return to have it checked, as these can be an indication of a skin cancer. ? ? ?Cryotherapy Aftercare ? ?Wash gently with soap and water everyday.   ?Apply Vaseline and Band-Aid daily until healed.  ? ? ?If You Need Anything After Your Visit ? ?If you have any questions or concerns for your doctor, please call our main line at [phone] and press option 4 to reach your doctor's medical assistant. If no one answers, please leave a voicemail as directed and we will return your call as soon as possible. Messages left after 4 pm will be answered the following business day.  ? ?You may also send us a message via MyChart. We typically respond to MyChart messages within 1-2 business  days. ? ?For prescription refills, please ask your pharmacy to contact our office. Our fax number is [phone]. ? ?If you have an urgent issue when the clinic is closed that cannot wait until the next business day, you can page your doctor at the number below.   ? ?Please note that while we do our best to be available for urgent issues outside of office hours, we are not available 24/7.  ? ?If you have an urgent issue and are unable to reach us, you may choose to seek medical care at your doctor's office, retail clinic, urgent care center, or emergency room. ? ?If you have a medical emergency, please immediately call 911 or go to the emergency department. ? ?Pager Numbers ? ?- Dr. Kowalski: [phone] ? ?- Dr. Moye: [phone] ? ?- Dr. Stewart: [phone] ? ?In the event of inclement weather, please call our main line at [phone] for an update on the status of any delays or closures. ? ?Dermatology Medication Tips: ?Please keep the boxes that topical medications come in in order to help keep track of the instructions about where and how to use these. Pharmacies typically print the medication instructions only on the boxes and not directly on the medication tubes.  ? ?If your medication is too expensive, please contact our office at [phone] option 4 or send us a message through MyChart.  ? ?We are unable to tell what your co-pay for medications will be in advance   as this is different depending on your insurance coverage. However, we may be able to find a substitute medication at lower cost or fill out paperwork to get insurance to cover a needed medication.  ? ?If a prior authorization is required to get your medication covered by your insurance company, please allow us 1-2 business days to complete this process. ? ?Drug prices often vary depending on where the prescription is filled and some pharmacies may offer cheaper prices. ? ?The website www.goodrx.com contains coupons for medications through  different pharmacies. The prices here do not account for what the cost may be with help from insurance (it may be cheaper with your insurance), but the website can give you the price if you did not use any insurance.  ?- You can print the associated coupon and take it with your prescription to the pharmacy.  ?- You may also stop by our office during regular business hours and pick up a GoodRx coupon card.  ?- If you need your prescription sent electronically to a different pharmacy, notify our office through [facility] MyChart or by phone at [phone] option 4. ? ? ? ? ?Si Usted Necesita Algo Despu?s de Su Visita ? ?Tambi?n puede enviarnos un mensaje a trav?s de MyChart. Por lo general respondemos a los mensajes de MyChart en el transcurso de 1 a 2 d?as h?biles. ? ?Para renovar recetas, por favor pida a su farmacia que se ponga en contacto con nuestra oficina. Nuestro n?mero de fax es el [phone]. ? ?Si tiene un asunto urgente cuando la cl?nica est? cerrada y que no puede esperar hasta el siguiente d?a h?bil, puede llamar/localizar a su doctor(a) al n?mero que aparece a continuaci?n.  ? ?Por favor, tenga en cuenta que aunque hacemos todo lo posible para estar disponibles para asuntos urgentes fuera del horario de oficina, no estamos disponibles las 24 horas del d?a, los 7 d?as de la semana.  ? ?Si tiene un problema urgente y no puede comunicarse con nosotros, puede optar por buscar atenci?n m?dica  en el consultorio de su doctor(a), en una cl?nica privada, en un centro de atenci?n urgente o en una sala de emergencias. ? ?Si tiene una emergencia m?dica, por favor llame inmediatamente al 911 o vaya a la sala de emergencias. ? ?N?meros de b?per ? ?- Dr. Kowalski: [phone] ? ?- Dra. Moye: [phone] ? ?- Dra. Stewart: [phone] ? ?En caso de inclemencias del tiempo, por favor llame a nuestra l?nea principal al [phone] para una actualizaci?n sobre el estado de cualquier retraso o cierre. ? ?Consejos  para la medicaci?n en dermatolog?a: ?Por favor, guarde las cajas en las que vienen los medicamentos de uso t?pico para ayudarle a seguir las instrucciones sobre d?nde y c?mo usarlos. Las farmacias generalmente imprimen las instrucciones del medicamento s?lo en las cajas y no directamente en los tubos del medicamento.  ? ?Si su medicamento es muy caro, por favor, p?ngase en contacto con nuestra oficina llamando al [phone] y presione la opci?n 4 o env?enos un mensaje a trav?s de MyChart.  ? ?No podemos decirle cu?l ser? su copago por los medicamentos por adelantado ya que esto es diferente dependiendo de la cobertura de su seguro. Sin embargo, es posible que podamos encontrar un medicamento sustituto a menor costo o llenar un formulario para que el seguro cubra el medicamento que se considera necesario.  ? ?Si se requiere una autorizaci?n previa para que su compa??a de seguros cubra su medicamento, por favor perm?tanos de 1 a 2 d?as   h?biles para completar este proceso. ? ?Los precios de los medicamentos var?an con frecuencia dependiendo del lugar de d?nde se surte la receta y alguna farmacias pueden ofrecer precios m?s baratos. ? ?El sitio web www.goodrx.com tiene cupones para medicamentos de diferentes farmacias. Los precios aqu? no tienen en cuenta lo que podr?a costar con la ayuda del seguro (puede ser m?s barato con su seguro), pero el sitio web puede darle el precio si no utiliz? ning?n seguro.  ?- Puede imprimir el cup?n correspondiente y llevarlo con su receta a la farmacia.  ?- Tambi?n puede pasar por nuestra oficina durante el horario de atenci?n regular y recoger una tarjeta de cupones de GoodRx.  ?- Si necesita que su receta se env?e electr?nicamente a una farmacia diferente, informe a nuestra oficina a trav?s de MyChart de [facility] o por tel?fono llamando al [phone] y presione la opci?n 4. ? ?

## 2021-04-09 NOTE — Progress Notes (Signed)
° °  Follow-Up Visit   Subjective  Robyn Washington is a 87 y.o. female who presents for the following: Follow-up (Pt has ISK on right temporal scalp that was treated at last visit with Ln2 - pt can still feel spot in scalp).  She has just restarted the triple cream for rosacea from [facility], and had a lot of redness on her cheeks.  She also uses Differin 0.1% gel for milia and it seems to help.     The following portions of the chart were reviewed this encounter and updated as appropriate:      Review of Systems: No other skin or systemic complaints except as noted in HPI or Assessment and Plan.   Objective  Well appearing patient in no apparent distress; mood and affect are within normal limits.  A focused examination was performed including scalp. Relevant physical exam findings are noted in the Assessment and Plan.  right temporal scalp x 1 Erythematous keratotic or waxy stuck-on papule. Residual.   Head - Anterior (Face) Mid face erythema on nose and cheeks, focal scale  Head - Anterior (Face) Smooth white papule(s).    Assessment & Plan  Inflamed seborrheic keratosis right temporal scalp x 1    Destruction of lesion - right temporal scalp x 1  Destruction method: cryotherapy   Informed consent: discussed and consent obtained   Lesion destroyed using liquid nitrogen: Yes   Region frozen until ice ball extended beyond lesion: Yes   Outcome: patient tolerated procedure well with no complications   Post-procedure details: wound care instructions given   Additional details:  Prior to procedure, discussed risks of blister formation, small wound, skin dyspigmentation, or rare scar following cryotherapy. Recommend Vaseline ointment to treated areas while healing.   Rosacea Head - Anterior (Face)  With recent irritant reaction from triple cream  Continue triple cream from [employer]. Apply a moisturizer first and then apply thin layer qhs as tolerated.    Rosacea is a chronic progressive skin condition usually affecting the face of adults, causing redness and/or acne bumps. It is treatable but not curable. It sometimes affects the eyes (ocular rosacea) as well. It may respond to topical and/or systemic medication and can flare with stress, sun exposure, alcohol, exercise and some foods.  Daily application of broad spectrum spf 30+ sunscreen to face is recommended to reduce flares.   Milia Head - Anterior (Face)  Continue otc Differin 0.01% gel qhs as tolerated.   Effaclar 0.1% gel samples given    Return in 3 months (on 07/07/2021) for TBSE as scheduled.  I, Harriett Sine, CMA, am acting as scribe for Brendolyn Patty, MD.  Documentation: I have reviewed the above documentation for accuracy and completeness, and I agree with the above.  Brendolyn Patty MD

## 2021-04-23 ENCOUNTER — Encounter: Payer: Self-pay | Admitting: Cardiovascular Disease

## 2021-04-23 ENCOUNTER — Other Ambulatory Visit: Payer: Self-pay

## 2021-04-23 ENCOUNTER — Ambulatory Visit (INDEPENDENT_AMBULATORY_CARE_PROVIDER_SITE_OTHER): Payer: Medicare Other | Admitting: Cardiovascular Disease

## 2021-04-23 VITALS — BP 140/86 | HR 66 | Ht 66.0 in | Wt 108.5 lb

## 2021-04-23 DIAGNOSIS — R55 Syncope and collapse: Secondary | ICD-10-CM | POA: Diagnosis not present

## 2021-04-23 DIAGNOSIS — I7300 Raynaud's syndrome without gangrene: Secondary | ICD-10-CM

## 2021-04-23 DIAGNOSIS — I471 Supraventricular tachycardia: Secondary | ICD-10-CM

## 2021-04-23 DIAGNOSIS — K224 Dyskinesia of esophagus: Secondary | ICD-10-CM

## 2021-04-23 DIAGNOSIS — Z87898 Personal history of other specified conditions: Secondary | ICD-10-CM | POA: Diagnosis not present

## 2021-04-23 NOTE — Progress Notes (Signed)
Cardiology Office Note ? ?Date:  04/23/2021  ? ?ID:  Saralynn Langhorst, DOB Feb 22, 1935, MRN [id number] ? ?PCP:  Venia Carbon, MD  ? ?Chief Complaint  ?Patient presents with  ? Fatigue  ?  Patient c/o an increase in fatigue & shortness of breath. Medications reviewed by the patient verbally.   ? ? ?HPI:  ?BERENISE HUNTON is a 87 y.o. female with a history of  ?SVT,  ?mild aortic insufficiency, ?chronic venous insufficiency  ?Osteoporosis,back issues ?Nutcracker esoph ?who presents for follow-up of her near syncope, SOB ? ?Last seen in clinic September 2022 ?We received a phone call 1 month ago with request for Zio monitor ? ?Over the phone she reported increased shortness of breath, fatigue, episodic tachycardia ? ?Reports having symptoms mostly in the mornings ?"Heart working too hard", "pumping" ?Fast heart rate, general malaise ?Better after drinking coffee ? ?Does not seem to be bothered by exercise ?Staying hydrated ?Weight remaining low, though stable ?Likes chocolate ? ?On prior office visits reported shortness of breath getting out of bed, not with exercise ? ?Avid exerciser,  rows/treadmill/bike, daily ? ?episode of near syncope last august 2021 ? ?Recently cut back on her Cymbalta, felt this was causing symptoms ? ?EKG personally reviewed by myself on todays visit ?Normal sinus rhythm rate 66 bpm no significant ST-T wave changes, PAC ? ?Of past medical history reviewed ?got out of bed when she felt very lightheaded and weak.  ?She had to sit back down.  ? nauseated and noticed her heart racing. She put her head between her knees. She was drenched in perspiration but did not lose consciousness.  ?She walked to the bathroom and felt like she was pale when looking at herself in the mirror. She cannot identify any triggers. She slept okay the night before.  brief palpitations ? ?prior cardiology evaluation at [facility] ?Did have 8 episodes of SVT--and occasional palpitations ?Did try vagal maneuver for this (with  bad spell with diaphoresis) ?No chest pain or SOB ? ?Echo ?1. The left ventricle is normal in size with normal wall thickness. ? 2. The left ventricular systolic function is normal, LVEF is visually ?estimated at > 55%. ? 3. The aortic valve is trileaflet with mildly thickened leaflets with normal ?excursion. ? 4. There is mild to moderate aortic regurgitation. ? 5. The right ventricle is normal in size, with normal systolic function. ? 6. There is mild to moderate tricuspid regurgitation. ? 7. IVC size and inspiratory change suggest mildly elevated right atrial ?pressure. (5-10 mmHg). ? ? ?PMH:   has a past medical history of Basal cell carcinoma (2014), Diverticulitis, Hypothyroidism, IBS (irritable bowel syndrome), Lumbar degenerative disc disease, Mood disorder ([place]) (2011), Osteopenia, Raynaud's syndrome, and Rosacea. ? ?PSH:    ?Past Surgical History:  ?Procedure Laterality Date  ? MOHS SURGERY  2017  ? BSC?  ? PILONIDAL CYST EXCISION  1958  ? TONSILLECTOMY AND ADENOIDECTOMY    ? ? ?Current Outpatient Medications  ?Medication Sig Dispense Refill  ? Calcium Carbonate-Vit D-Min (CALCIUM 600+D PLUS MINERALS) 600-400 MG-UNIT TABS Take by mouth.    ? denosumab (PROLIA) 60 MG/ML SOSY injection Inject 60 mg into the skin every 6 (six) months.    ? doxycycline (ADOXA) 100 MG tablet Take 1 tablet (100 mg total) by mouth daily as needed (for rosacea flares). 30 tablet 5  ? DULoxetine (CYMBALTA) 30 MG capsule Take 1 capsule (30 mg total) by mouth daily. 90 capsule 3  ? fexofenadine (ALLEGRA) 180 MG  tablet Take 180 mg by mouth as needed.    ? levothyroxine (SYNTHROID) 75 MCG tablet TAKE 1 TABLET BY MOUTH  DAILY 90 tablet 3  ? Multiple Vitamins-Minerals (CENTRUM SILVER) tablet Take by mouth.    ? omeprazole (PRILOSEC) 20 MG capsule Take 1 capsule (20 mg total) by mouth daily as needed. 90 capsule 3  ? timolol (TIMOPTIC) 0.5 % ophthalmic solution     ? triamcinolone cream (KENALOG) 0.1 % Apply to itchy rash 1-2 times daily  until improved. Avoid face, groin, underarms. 240 g 0  ? famotidine (PEPCID) 20 MG tablet Take 20 mg by mouth 2 (two) times daily as needed for heartburn or indigestion. (Patient not taking: Reported on 04/23/2021)    ? ?No current facility-administered medications for this visit.  ? ? ? ?Allergies:   Bioflavonoids, Banana, Barley grass, Pear, Pineapple, Rice, Rye grass flower pollen extract  [gramineae pollens], Wheat bran, and Hydroxychloroquine  ? ?Social History:  The patient  reports that she has never smoked. She has never used smokeless tobacco. She reports that she does not drink alcohol and does not use drugs.  ? ?Family History:   family history includes Alcohol abuse in her father; Diabetes in her mother and sister; Thyroid cancer in her daughter.  ? ? ?Review of Systems: ?Review of Systems  ?Constitutional:  Positive for malaise/fatigue.  ?HENT: Negative.    ?Respiratory: Negative.    ?Cardiovascular: Negative.   ?Gastrointestinal: Negative.   ?Musculoskeletal: Negative.   ?Neurological: Negative.   ?Psychiatric/Behavioral: Negative.    ?All other systems reviewed and are negative. ? ? ?PHYSICAL EXAM: ?VS:  BP 140/86 (BP Location: Left Arm, Patient Position: Sitting, Cuff Size: Normal)   Pulse 66   Ht 5\' 6"  (1.676 m)   Wt 108 lb 8 oz (49.2 kg)   SpO2 98%   BMI 17.51 kg/m?  , BMI Body mass index is 17.51 kg/m?Marland Kitchen ?Constitutional:  oriented to person, place, and time. No distress.  Very thin ?HENT:  ?Head: Grossly normal ?Eyes:  no discharge. No scleral icterus.  ?Neck: No JVD, no carotid bruits  ?Cardiovascular: Regular rate and rhythm, no murmurs appreciated ?Pulmonary/Chest: Clear to auscultation bilaterally, no wheezes or rails ?Abdominal: Soft.  no distension.  no tenderness.  ?Musculoskeletal: Normal range of motion ?Neurological:  normal muscle tone. Coordination normal. No atrophy ?Skin: Skin warm and dry ?Psychiatric: normal affect, pleasant ? ? ?Recent Labs: ?02/25/2021: ALT 29; BUN 19;  Creatinine, Ser 0.62; Hemoglobin 13.0; Platelets 189.0; Potassium 4.0; Sodium 140; TSH 0.96  ? ? ?Lipid Panel ?Lab Results  ?Component Value Date  ? CHOL 187 11/12/2020  ? HDL 78 11/12/2020  ? [place] 94 11/12/2020  ? TRIG 86 11/12/2020  ? ?  ? ?Wt Readings from Last 3 Encounters:  ?04/23/21 108 lb 8 oz (49.2 kg)  ?02/25/21 108 lb (49 kg)  ?11/12/20 105 lb 6 oz (47.8 kg)  ?  ? ?ASSESSMENT AND PLAN: ? ?Problem List Items Addressed This Visit   ? ?  ? Cardiology Problems  ? SVT (supraventricular tachycardia) ([place]) - Primary  ? Relevant Orders  ? EKG 12-Lead  ? Raynaud's syndrome  ? ?Other Visit Diagnoses   ? ? Near syncope      ? Relevant Orders  ? EKG 12-Lead  ? ?  ? ?1. Near syncope/paroxysmal tachycardia ?Recommended a monitor such as Apple Watch given paroxysmal episodes ?Concern for arrhythmia given periodic presentation ?Zio monitor certainly available, this was discussed ?As most of the symptoms in  the morning recommended she closely monitor her pulse.  Given Raynaud's, would likely not benefit from cardio mobile device.  Fingers are too cold to register even at the gym ?In the past has declined beta-blockers ? ?2. SVT (supraventricular tachycardia) ?Prior Zio patch monitor 4/11 showing 2.1% PVC burden as well as 8 episodes of narrow complex tachycardia. ?Continues to have episodic symptoms particular in the morning, typically alleviated by drinking coffee ?Does not affect her exercise capacity ?Heart rate monitoring as above, consider Apple Watch or Zio monitor ? ?3. Aortic valve insufficiency ?Mild to moderate on echo ?No change in murmur on exam ?Likely does not need repeat study ? ?4. HTN ?Blood pressure is well controlled on today's visit. No changes made to the medications. ? ?5.  Fatigue ?Active, exercising ?Lab work stable ? ? Total encounter time more than 40 minutes ? Greater than 50% was spent in counseling and coordination of care with the patient ? ? ? ?Signed, ?Esmond Plants, M.D., Ph.D. ?[facility] [place], [place] ?[phone] ?

## 2021-04-23 NOTE — Patient Instructions (Addendum)
Thank about apple watch ?Watch for arrhythmia ? ?Medication Instructions:  ?No changes ? ?If you need a refill on your cardiac medications before your next appointment, please call your pharmacy.  ? ?Lab work: ?No new labs needed ? ?Testing/Procedures: ?No new testing needed ? ?Follow-Up: ?At [facility], you and your health needs are our priority.  As part of our continuing mission to provide you with exceptional heart care, we have created designated Provider Care Teams.  These Care Teams include your primary Cardiologist (physician) and Advanced Practice Providers (APPs -  Physician Assistants and Nurse Practitioners) who all work together to provide you with the care you need, when you need it. ? ?You will need a follow up appointment in 6 months, APP ? ?Providers on your designated Care Team:   ?Murray Hodgkins, NP ?Christell Faith, PA-C ?Cadence Kathlen Mody, PA-C ? ?COVID-19 Vaccine Information can be found at: [email or web address].uk For questions related to vaccine distribution or appointments, please email [email or web address] or call [phone].  ? ?

## 2021-05-11 ENCOUNTER — Other Ambulatory Visit: Payer: Self-pay | Admitting: Internal Medicine

## 2021-05-12 NOTE — Telephone Encounter (Signed)
Last PCP apt 02/25/21. ?Next apt 02/27/22 ?Sent in script ?

## 2021-05-28 ENCOUNTER — Telehealth: Payer: Self-pay | Admitting: Internal Medicine

## 2021-05-28 NOTE — Telephone Encounter (Signed)
I have made [facility] the favorite pharmacy. ?

## 2021-05-28 NOTE — Telephone Encounter (Signed)
Pt called stating that when her medication DULoxetine (CYMBALTA) 30 MG capsule is ready for refill pt does not want it sent to [facility]. [facility] [place]. Please advise. ?

## 2021-06-03 ENCOUNTER — Encounter: Payer: Self-pay | Admitting: Internal Medicine

## 2021-06-04 ENCOUNTER — Other Ambulatory Visit: Payer: Self-pay

## 2021-06-04 MED ORDER — DULOXETINE HCL 30 MG PO CPEP: 30.0000 mg | ORAL_CAPSULE | Freq: Every day | ORAL | 3 refills | Status: DC

## 2021-07-07 ENCOUNTER — Ambulatory Visit (INDEPENDENT_AMBULATORY_CARE_PROVIDER_SITE_OTHER): Payer: Medicare Other | Admitting: Dermatology

## 2021-07-07 DIAGNOSIS — D692 Other nonthrombocytopenic purpura: Secondary | ICD-10-CM

## 2021-07-07 DIAGNOSIS — L821 Other seborrheic keratosis: Secondary | ICD-10-CM

## 2021-07-07 DIAGNOSIS — L814 Other melanin hyperpigmentation: Secondary | ICD-10-CM

## 2021-07-07 DIAGNOSIS — L719 Rosacea, unspecified: Secondary | ICD-10-CM

## 2021-07-07 DIAGNOSIS — L300 Nummular dermatitis: Secondary | ICD-10-CM | POA: Diagnosis not present

## 2021-07-07 DIAGNOSIS — L578 Other skin changes due to chronic exposure to nonionizing radiation: Secondary | ICD-10-CM

## 2021-07-07 DIAGNOSIS — Z85828 Personal history of other malignant neoplasm of skin: Secondary | ICD-10-CM

## 2021-07-07 DIAGNOSIS — L720 Epidermal cyst: Secondary | ICD-10-CM | POA: Diagnosis not present

## 2021-07-07 DIAGNOSIS — D1800 Hemangioma unspecified site: Secondary | ICD-10-CM

## 2021-07-07 DIAGNOSIS — Z1283 Encounter for screening for malignant neoplasm of skin: Secondary | ICD-10-CM

## 2021-07-07 DIAGNOSIS — D229 Melanocytic nevi, unspecified: Secondary | ICD-10-CM

## 2021-07-07 MED ORDER — DOXYCYCLINE HYCLATE 20 MG PO TABS: ORAL_TABLET | ORAL | 3 refills | Status: DC

## 2021-07-07 NOTE — Progress Notes (Signed)
? ?Follow-Up Visit ?  ?Subjective  ?Robyn Washington is a 87 y.o. female who presents for the following: Annual Exam (Mole check ). Recheck rosacea on face treating with otc Differin gel, pt stopped Rosacea triple cream because she think it may have irritated her skin . She takes doxy 100 mg Prn flares, but needs to take it frequently. ?The patient presents for Total-Body Skin Exam (TBSE) for skin cancer screening and mole check.  The patient has spots, moles and lesions to be evaluated, some may be new or changing and the patient has concerns that these could be cancer. She also has itchy rash on arms, back, and uses [facility] cream prn itch. ? ? ?The following portions of the chart were reviewed this encounter and updated as appropriate:  ?  ?  ? ?Review of Systems:  No other skin or systemic complaints except as noted in HPI or Assessment and Plan. ? ?Objective  ?Well appearing patient in no apparent distress; mood and affect are within normal limits. ? ?A full examination was performed including scalp, head, eyes, ears, nose, lips, neck, chest, axillae, abdomen, back, buttocks, bilateral upper extremities, bilateral lower extremities, hands, feet, fingers, toes, fingernails, and toenails. All findings within normal limits unless otherwise noted below. ? ?face ?erythema and telangectasia on malar cheeks  ? ?Head - Anterior (Face) ?Scattered firm white papules ? ?left upper back ?Pink excoriated patch at left upper back  ? ? ? ?Assessment & Plan  ?Rosacea ?face ? ?Chronic and persistent condition with duration or expected duration over one year. Condition is symptomatic/ bothersome to patient. Not currently at goal.  ? ?Rosacea is a chronic progressive skin condition usually affecting the face of adults, causing redness and/or acne bumps. It is treatable but not curable. It sometimes affects the eyes (ocular rosacea) as well. It may respond to topical and/or systemic medication and can flare with stress, sun exposure,  alcohol, exercise and some foods.  Daily application of broad spectrum spf 30+ sunscreen to face is recommended to reduce flares.  ? ?Start Doxycycline 20 mg take 1 tablet twice a day with food for maintenance treatment ?May increase to 100 mg PO qd prn flares ?Restart Rosacea triple cream apply to face once a day as tolerated.  Pt will restart using less product. ? ?Samples of La Roche posay moisturizers and gentle cleansers given, also rec Elta MD clear tinted daily moisturizer with spf ? ? ?Doxycycline should be taken with food to prevent nausea. Do not lay down for 30 minutes after taking. Be cautious with sun exposure and use good sun protection while on this medication. Pregnant women should not take this medication.   ? ? ?Related Medications ?doxycycline (PERIOSTAT) 20 MG tablet ?Take 1 tablet twice a day with food and drink ? ?Milia ?Head - Anterior (Face) ? ?Continue otc Differin 0.1% gel apply to face at bedtime  ? ?Samples of La Roche posay Efflaclar gel given  ? ?Nummular dermatitis ?left upper back ? ?Chronic and persistent condition with duration or expected duration over one year. Condition is symptomatic/ bothersome to patient. Not currently at goal.  ? ?Nummular dermatitis is a chronic, relapsing, pruritic condition that can significantly affect quality of life. It is often associated with allergic rhinitis and/or asthma and can require treatment with topical medications, phototherapy, or in severe cases biologic injectable medication (Dupixent; Adbry) or Oral JAK inhibitors.  ? ?restart Triamcinolone cream apply to itchy skin on the back qd-bid prn, Avoid applying to face,  groin, and axilla. Use as directed. Long-term use can cause thinning of the skin.  ? ?Recommend mild soap and moisturizing cream 1-2 times daily.  ? ?Topical steroids (such as triamcinolone, fluocinolone, fluocinonide, mometasone, clobetasol, halobetasol, betamethasone, hydrocortisone) can cause thinning and lightening of the  skin if they are used for too long in the same area. Your physician has selected the right strength medicine for your problem and area affected on the body. Please use your medication only as directed by your physician to prevent side effects.   ? ? ?Lentigines ?- Scattered tan macules ?- Due to sun exposure ?- Benign-appearing, observe ?- Recommend daily broad spectrum sunscreen SPF 30+ to sun-exposed areas, reapply every 2 hours as needed. ?- Call for any changes ? ?Seborrheic Keratoses ?- Stuck-on, waxy, tan-brown papules and/or plaques  ?- Benign-appearing ?- Discussed benign etiology and prognosis. ?- Observe ?- Call for any changes ? ?Melanocytic Nevi ?- Tan-brown and/or pink-flesh-colored symmetric macules and papules ?- Benign appearing on exam today ?- Observation ?- Call clinic for new or changing moles ?- Recommend daily use of broad spectrum spf 30+ sunscreen to sun-exposed areas.  ? ?Hemangiomas ?- Red papules ?- Discussed benign nature ?- Observe ?- Call for any changes ? ?Actinic Damage ?- Chronic condition, secondary to cumulative UV/sun exposure ?- diffuse scaly erythematous macules with underlying dyspigmentation ?- Recommend daily broad spectrum sunscreen SPF 30+ to sun-exposed areas, reapply every 2 hours as needed.  ?- Staying in the shade or wearing long sleeves, sun glasses (UVA+UVB protection) and wide brim hats (4-inch brim around the entire circumference of the hat) are also recommended for sun protection.  ?- Call for new or changing lesions. ? ?Purpura - Chronic; persistent and recurrent.  Treatable, but not curable. ?Left 2nd toenail- hx of trauma  ?Observe  ?- Violaceous macules and patches ?- Benign ?- Related to trauma, age, sun damage and/or use of blood thinners, chronic use of topical and/or oral steroids ?- Observe ?- Can use OTC arnica containing moisturizer such as Dermend Bruise Formula if desired ?- Call for worsening or other concerns  ? ?History of Basal Cell Carcinoma of  the Skin ?Right forehead 2014 ?- No evidence of recurrence today ?- Recommend regular full body skin exams ?- Recommend daily broad spectrum sunscreen SPF 30+ to sun-exposed areas, reapply every 2 hours as needed.  ?- Call if any new or changing lesions are noted between office visits  ? ?Skin cancer screening performed today.  ? ?Return in about 1 year (around 07/08/2022) for TBSE, hx of BCC, Rosacea . ? ?I, Marye Round, CMA, am acting as scribe for Brendolyn Patty, MD .  ? ?Documentation: I have reviewed the above documentation for accuracy and completeness, and I agree with the above. ? ?Brendolyn Patty MD  ?

## 2021-07-07 NOTE — Patient Instructions (Addendum)
ELTA MD 48 clear with tint sun protection  ? ?Vanicream shampoo and conditioner  ? ? ?If You Need Anything After Your Visit ? ?If you have any questions or concerns for your doctor, please call our main line at [phone] and press option 4 to reach your doctor's medical assistant. If no one answers, please leave a voicemail as directed and we will return your call as soon as possible. Messages left after 4 pm will be answered the following business day.  ? ?You may also send [place] a message via MyChart. We typically respond to MyChart messages within 1-2 business days. ? ?For prescription refills, please ask your pharmacy to contact our office. Our fax number is [phone]. ? ?If you have an urgent issue when the clinic is closed that cannot wait until the next business day, you can page your doctor at the number below.   ? ?Please note that while we do our best to be available for urgent issues outside of office hours, we are not available 24/7.  ? ?If you have an urgent issue and are unable to reach [place], you may choose to seek medical care at your doctor's office, retail clinic, urgent care center, or emergency room. ? ?If you have a medical emergency, please immediately call 911 or go to the emergency department. ? ?Pager Numbers ? ?- Dr. Nehemiah Massed: [phone] ? ?- Dr. Laurence Ferrari: [phone] ? ?- Dr. Nicole Kindred: [phone] ? ?In the event of inclement weather, please call our main line at [phone] for an update on the status of any delays or closures. ? ?Dermatology Medication Tips: ?Please keep the boxes that topical medications come in in order to help keep track of the instructions about where and how to use these. Pharmacies typically print the medication instructions only on the boxes and not directly on the medication tubes.  ? ?If your medication is too expensive, please contact our office at [phone] option 4 or send [place] a message through [place].  ? ?We are unable to tell what your co-pay for  medications will be in advance as this is different depending on your insurance coverage. However, we may be able to find a substitute medication at lower cost or fill out paperwork to get insurance to cover a needed medication.  ? ?If a prior authorization is required to get your medication covered by your insurance company, please allow [place] 1-2 business days to complete this process. ? ?Drug prices often vary depending on where the prescription is filled and some pharmacies may offer cheaper prices. ? ?The website www.goodrx.com contains coupons for medications through different pharmacies. The prices here do not account for what the cost may be with help from insurance (it may be cheaper with your insurance), but the website can give you the price if you did not use any insurance.  ?- You can print the associated coupon and take it with your prescription to the pharmacy.  ?- You may also stop by our office during regular business hours and pick up a GoodRx coupon card.  ?- If you need your prescription sent electronically to a different pharmacy, notify our office through [facility] or by phone at [phone] option 4. ? ? ? ? ?Si Usted Necesita Algo Despu?s de Su Visita ? ?Tambi?n puede enviarnos un mensaje a trav?s de MyChart. Por lo general respondemos a los mensajes de MyChart en el transcurso de 1 a 2 d?as h?biles. ? ?Para renovar recetas, por favor pida a su farmacia que  se ponga en contacto con nuestra oficina. Nuestro n?mero de fax es el [phone]. ? ?Si tiene un asunto urgente cuando la cl?nica est? cerrada y que no puede esperar hasta el siguiente d?a h?bil, puede llamar/localizar a su doctor(a) al n?mero que aparece a continuaci?n.  ? ?Por favor, tenga en cuenta que aunque hacemos todo lo posible para estar disponibles para asuntos urgentes fuera del horario de oficina, no estamos disponibles las 24 horas del d?a, los 7 d?as de la semana.  ? ?Si tiene un problema urgente y no puede  comunicarse con nosotros, puede optar por buscar atenci?n m?dica  en el consultorio de su doctor(a), en una cl?nica privada, en un centro de atenci?n urgente o en una sala de emergencias. ? ?Si tiene Engineer, maintenance (IT) m?dica, por favor llame inmediatamente al 911 o vaya a la sala de emergencias. ? ?N?meros de b?per ? ?- Dr. Nehemiah Massed: [phone] ? ?- Dra. Moye: [phone] ? ?- Dra. Nicole Kindred: [phone] ? ?En caso de inclemencias del tiempo, por favor llame a nuestra l?nea principal al [phone] para una actualizaci?n sobre el estado de cualquier retraso o cierre. ? ?Consejos para la medicaci?n en dermatolog?a: ?Por favor, guarde las cajas en las que vienen los medicamentos de uso t?pico para ayudarle a seguir las instrucciones sobre d?nde y c?mo usarlos. Las farmacias generalmente imprimen las instrucciones del medicamento s?lo en las cajas y no directamente en los tubos del [place].  ? ?Si su medicamento es muy caro, por favor, p?ngase en contacto con Zigmund Daniel llamando al [phone] y presione la opci?n 4 o env?enos un mensaje a trav?s de MyChart.  ? ?No podemos decirle cu?l ser? su copago por los medicamentos por adelantado ya que esto es diferente dependiendo de la cobertura de su seguro. Sin embargo, es posible que podamos encontrar un medicamento sustituto a Electrical engineer un formulario para que el seguro cubra el medicamento que se considera necesario.  ? ?Si se requiere Ardelia Mems autorizaci?n previa para que su compa??a de seguros [place] su medicamento, por favor perm?tanos de 1 a 2 d?as h?biles para completar este proceso. ? ?Los precios de los medicamentos var?an con frecuencia dependiendo del Environmental consultant de d?nde se surte la receta y alguna farmacias pueden ofrecer precios m?s baratos. ? ?El sitio web www.goodrx.com tiene cupones para medicamentos de Airline pilot. Los precios aqu? no tienen en cuenta lo que podr?a costar con la ayuda del seguro (puede ser m?s barato con su seguro), pero  el sitio web puede darle el precio si no utiliz? ning?n seguro.  ?- Puede imprimir el cup?n correspondiente y llevarlo con su receta a la farmacia.  ?- Tambi?n puede pasar por nuestra oficina durante el horario de atenci?n regular y recoger una tarjeta de cupones de GoodRx.  ?- Si necesita que su receta se env?e electr?nicamente a Chiropodist, informe a nuestra oficina a trav?s de MyChart de [place] o por tel?fono llamando al [phone] y presione la opci?n 4.  ?

## 2021-08-19 ENCOUNTER — Telehealth: Payer: Self-pay | Admitting: Cardiovascular Disease

## 2021-08-19 DIAGNOSIS — I471 Supraventricular tachycardia: Secondary | ICD-10-CM

## 2021-08-19 DIAGNOSIS — R55 Syncope and collapse: Secondary | ICD-10-CM

## 2021-08-20 ENCOUNTER — Ambulatory Visit (INDEPENDENT_AMBULATORY_CARE_PROVIDER_SITE_OTHER): Payer: Medicare Other

## 2021-08-20 DIAGNOSIS — R55 Syncope and collapse: Secondary | ICD-10-CM

## 2021-08-20 DIAGNOSIS — I471 Supraventricular tachycardia: Secondary | ICD-10-CM

## 2021-08-20 NOTE — Telephone Encounter (Signed)
Spoke with patient  Pt reports that yesterday she was getting out of bed and she broke out in a huge sweat and she started seeing spots. She was able to get down and get her head between her legs and it eventually passed.   She checked her HR and BP several hours later and he HR was 51 and by was 104/57.   Patient is wondering if she wears another zio if it will show anything new and if it is worthwhile. Advised patient that it may not show a new arrhythmia, however it may show that she has an increased PVC burden or more episodes of narrow complex tachycardia, and so would be worthwhile.   Pt amenable to having another zio completed.   Order placed. Instructions gone over with patient.   Pt advised that if she doesn't receive the monitor by the middle of next week to please call us and let us know.   Pt verbalized understanding of everything discussed and voiced appreciation for the call.

## 2021-08-20 NOTE — Telephone Encounter (Signed)
Minna Merritts, MD     The way symptoms are described it is concerning for low blood pressure though unable to exclude arrhythmia  Needed blood pressure measurements when she is having symptoms not hours later  Blood pressure listed was low but that was hours later  Does not appear she is on any blood pressure medication  If there is documentation of low blood pressure when she has these episodes a pill may be needed to support her blood pressure such as midodrine  Would recommend a large glass of water sitting by the bedside and would hydrate aggressively first thing in the morning before she gets going  [place] patient and advised her of above recommendations. Patient verbalized understanding and voiced appreciation for the call.

## 2021-08-25 ENCOUNTER — Telehealth: Payer: Self-pay | Admitting: Cardiovascular Disease

## 2021-08-25 DIAGNOSIS — I471 Supraventricular tachycardia: Secondary | ICD-10-CM

## 2021-08-25 DIAGNOSIS — R55 Syncope and collapse: Secondary | ICD-10-CM | POA: Diagnosis not present

## 2021-08-25 NOTE — Telephone Encounter (Signed)
Patient calling to find out how long she leaves her monitor on.

## 2021-08-25 NOTE — Telephone Encounter (Signed)
Reviewed monitor is to be worn for total of 2 weeks. She was appreciative for the call with no further questions at this time.

## 2021-09-12 ENCOUNTER — Other Ambulatory Visit: Payer: Self-pay | Admitting: Dermatology

## 2021-09-18 ENCOUNTER — Encounter: Payer: Self-pay | Admitting: Ophthalmology

## 2021-09-22 NOTE — Discharge Instructions (Signed)

## 2021-09-23 ENCOUNTER — Telehealth: Payer: Self-pay | Admitting: Emergency Medicine

## 2021-09-23 NOTE — Telephone Encounter (Signed)
-----   Message from Minna Merritts, MD sent at 09/23/2021  9:01 AM EDT ----- Elwyn Reach monitor Normal rhythm with rare short episodes of tachycardia, longest was 16 seconds Triggered events were associated with fast but normal rhythm, sinus tachycardia Rare extra beats noted, not overwhelming burden of extra beats No atrial fibrillation or other rhythms of concern This could be treated with low-dose beta-blocker such as metoprolol succinate 25 daily if desired for symptom control

## 2021-09-23 NOTE — Telephone Encounter (Signed)
Called and spoke with patient's daughter (per DPR) as patient's phone was off and voicemail not set  up.   Went over results and recommendations with pt's daughter. Benjamine Mola verbalized understanding. Stated that she would talk with patient and they would let us know know if pt wants to start metoprolol succinate.

## 2021-09-24 ENCOUNTER — Ambulatory Visit
Admission: RE | Admit: 2021-09-24 | Discharge: 2021-09-24 | Disposition: A | Discharge status: Home / Self Care | Payer: Medicare Other | Attending: Ophthalmology | Admitting: Ophthalmology

## 2021-09-24 ENCOUNTER — Encounter
Admission: RE | Disposition: A | Discharge status: Home / Self Care | Payer: Self-pay | Source: Home / Self Care | Attending: Ophthalmology

## 2021-09-24 ENCOUNTER — Ambulatory Visit (AMBULATORY_SURGERY_CENTER): Payer: Medicare Other | Admitting: Anesthesiology

## 2021-09-24 ENCOUNTER — Ambulatory Visit: Payer: Medicare Other | Admitting: Anesthesiology

## 2021-09-24 ENCOUNTER — Encounter: Payer: Self-pay | Admitting: Ophthalmology

## 2021-09-24 DIAGNOSIS — I10 Essential (primary) hypertension: Secondary | ICD-10-CM | POA: Diagnosis not present

## 2021-09-24 DIAGNOSIS — H2512 Age-related nuclear cataract, left eye: Secondary | ICD-10-CM

## 2021-09-24 DIAGNOSIS — E119 Type 2 diabetes mellitus without complications: Secondary | ICD-10-CM | POA: Diagnosis not present

## 2021-09-24 HISTORY — DX: Venous insufficiency (chronic) (peripheral): I87.2

## 2021-09-24 HISTORY — DX: Nonrheumatic aortic (valve) insufficiency: I35.1

## 2021-09-24 HISTORY — DX: Supraventricular tachycardia, unspecified: I47.10

## 2021-09-24 HISTORY — DX: Supraventricular tachycardia: I47.1

## 2021-09-24 HISTORY — PX: CATARACT EXTRACTION W/PHACO: SHX586

## 2021-09-24 SURGERY — PHACOEMULSIFICATION, CATARACT, WITH IOL INSERTION
Anesthesia: Monitor Anesthesia Care | Site: Eye | Laterality: Left

## 2021-09-24 MED ORDER — MIDAZOLAM HCL 2 MG/2ML IJ SOLN
INTRAMUSCULAR | Status: DC | PRN
  Administered 2021-09-24: 1 mg via INTRAVENOUS

## 2021-09-24 MED ORDER — SIGHTPATH DOSE#1 BSS IO SOLN
INTRAOCULAR | Status: DC | PRN
  Administered 2021-09-24: 15 mL

## 2021-09-24 MED ORDER — SIGHTPATH DOSE#1 NA HYALUR & NA CHOND-NA HYALUR IO KIT
PACK | INTRAOCULAR | Status: DC | PRN
  Administered 2021-09-24: 1 via OPHTHALMIC

## 2021-09-24 MED ORDER — ARMC OPHTHALMIC DILATING DROPS
1.0000 | OPHTHALMIC | Status: DC | PRN
  Administered 2021-09-24 (×3): 1 via OPHTHALMIC

## 2021-09-24 MED ORDER — BRIMONIDINE TARTRATE-TIMOLOL 0.2-0.5 % OP SOLN
OPHTHALMIC | Status: DC | PRN
  Administered 2021-09-24: 1 [drp] via OPHTHALMIC

## 2021-09-24 MED ORDER — SIGHTPATH DOSE#1 BSS IO SOLN
INTRAOCULAR | Status: DC | PRN
  Administered 2021-09-24: 77 mL via OPHTHALMIC

## 2021-09-24 MED ORDER — SIGHTPATH DOSE#1 BSS IO SOLN
INTRAOCULAR | Status: DC | PRN
  Administered 2021-09-24: 1 mL via INTRAMUSCULAR

## 2021-09-24 MED ORDER — FENTANYL CITRATE (PF) 100 MCG/2ML IJ SOLN
INTRAMUSCULAR | Status: DC | PRN
  Administered 2021-09-24: 50 ug via INTRAVENOUS

## 2021-09-24 MED ORDER — TETRACAINE HCL 0.5 % OP SOLN
1.0000 [drp] | OPHTHALMIC | Status: DC | PRN
  Administered 2021-09-24 (×3): 1 [drp] via OPHTHALMIC

## 2021-09-24 MED ORDER — CEFUROXIME OPHTHALMIC INJECTION 1 MG/0.1 ML
INJECTION | OPHTHALMIC | Status: DC | PRN
  Administered 2021-09-24: 0.1 mL via INTRACAMERAL

## 2021-09-24 SURGICAL SUPPLY — 13 items
CATARACT SUITE SIGHTPATH (MISCELLANEOUS) ×2 IMPLANT
FEE CATARACT SUITE SIGHTPATH (MISCELLANEOUS) ×1 IMPLANT
GLOVE SRG 8 PF TXTR STRL LF DI (GLOVE) ×1 IMPLANT
GLOVE SURG ENC TEXT LTX SZ7.5 (GLOVE) ×2 IMPLANT
GLOVE SURG UNDER POLY LF SZ8 (GLOVE) ×2
LENS IOL ACRSF VT TRC 415 20.0 IMPLANT
LENS IOL ACRYSOF VIVITY 20.0 ×2 IMPLANT
LENS IOL VIVITY 415 20.0 ×1 IMPLANT
NDL FILTER BLUNT 18X1 1/2 (NEEDLE) ×1 IMPLANT
NEEDLE FILTER BLUNT 18X 1/2SAF (NEEDLE) ×1
NEEDLE FILTER BLUNT 18X1 1/2 (NEEDLE) ×1 IMPLANT
SYR 3ML LL SCALE MARK (SYRINGE) ×2 IMPLANT
WATER STERILE IRR 250ML POUR (IV SOLUTION) ×2 IMPLANT

## 2021-09-24 NOTE — Anesthesia Preprocedure Evaluation (Signed)
Anesthesia Evaluation  Patient identified by MRN, date of birth, ID band Patient awake    Reviewed: Allergy & Precautions, H&P , NPO status , Patient's Chart, lab work & pertinent test results, reviewed documented beta blocker date and time   Airway Mallampati: II  TM Distance: >3 FB Neck ROM: full    Dental no notable dental hx. (+) Teeth Intact   Pulmonary neg pulmonary ROS,    Pulmonary exam normal breath sounds clear to auscultation       Cardiovascular Exercise Tolerance: Good hypertension, negative cardio ROS   Rhythm:regular Rate:Normal     Neuro/Psych negative neurological ROS  negative psych ROS   GI/Hepatic negative GI ROS, Neg liver ROS,   Endo/Other  negative endocrine ROSdiabetes  Renal/GU      Musculoskeletal   Abdominal   Peds  Hematology negative hematology ROS (+)   Anesthesia Other Findings   Reproductive/Obstetrics negative OB ROS                             Anesthesia Physical Anesthesia Plan  ASA: 3  Anesthesia Plan: MAC   Post-op Pain Management:    Induction:   PONV Risk Score and Plan:   Airway Management Planned:   Additional Equipment:   Intra-op Plan:   Post-operative Plan:   Informed Consent: I have reviewed the patients History and Physical, chart, labs and discussed the procedure including the risks, benefits and alternatives for the proposed anesthesia with the patient or authorized representative who has indicated his/her understanding and acceptance.       Plan Discussed with: CRNA  Anesthesia Plan Comments:         Anesthesia Quick Evaluation

## 2021-09-24 NOTE — Op Note (Signed)
LOCATION:  [facility]   PREOPERATIVE DIAGNOSIS:  Nuclear sclerotic cataract of the left eye.  H25.12  POSTOPERATIVE DIAGNOSIS:  Nuclear sclerotic cataract of the left eye.   PROCEDURE:  Phacoemulsification with Toric posterior chamber intraocular lens placement of the left eye.  Ultrasound time: Procedure(s): CATARACT EXTRACTION PHACO AND INTRAOCULAR LENS PLACEMENT (IOC) LEFT VIVITY TORIC 15.68 02:02.5 (Left)  LENS:   Implant Name Type Inv. Item Serial No. Manufacturer Lot No. LRB No. Used Action  ACRYSOF IQ VIVITY TORIC IOL Intraocular Lens  [id number]   Left 1 Implanted     [id number] Vivity Toric intraocular lens with 2.25 diopters of cylindrical power with axis orientation at 175 degrees.     SURGEON:  Wyonia Hough, MD   ANESTHESIA:  Topical with tetracaine drops and 2% Xylocaine jelly, augmented with 1% preservative-free intracameral lidocaine.  COMPLICATIONS:  None.   DESCRIPTION OF PROCEDURE:  The patient was identified in the holding room and transported to the operating suite and placed in the supine position under the operating microscope.  The left eye was identified as the operative eye, and it was prepped and draped in the usual sterile ophthalmic fashion.    A clear-corneal paracentesis incision was made at the 1:30 position.  0.5 ml of preservative-free 1% lidocaine was injected into the anterior chamber. The anterior chamber was filled with Viscoat.  A 2.4 millimeter near clear corneal incision was then made at the 10:30 position.  A cystotome and capsulorrhexis forceps were then used to make a curvilinear capsulorrhexis.  Hydrodissection and hydrodelineation were then performed using balanced salt solution.   Phacoemulsification was then used in stop and chop fashion to remove the lens, nucleus and epinucleus.  The remaining cortex was aspirated using the irrigation and aspiration handpiece.  Provisc viscoelastic was then placed into the capsular bag to  distend it for lens placement.  The Verion digital marker was used to align the implant at the intended axis.   A Toric lens was then injected into the capsular bag.  It was rotated clockwise until the axis marks on the lens were approximately 15 degrees in the counterclockwise direction to the intended alignment.  The viscoelastic was aspirated from the eye using the irrigation aspiration handpiece.  Then, a Koch spatula through the sideport incision was used to rotate the lens in a clockwise direction until the axis markings of the intraocular lens were lined up with the Verion alignment.  Balanced salt solution was then used to hydrate the wounds. Cefuroxime 0.1 ml of a '10mg'$ /ml solution was injected into the anterior chamber for a dose of 1 mg of intracameral antibiotic at the completion of the case.    The eye was noted to have a physiologic pressure and there was no wound leak noted.   Timolol and Brimonidine drops were applied to the eye.  The patient was taken to the recovery room in stable condition having had no complications of anesthesia or surgery.  Jonne Rote 09/24/2021, 10:47 AM

## 2021-09-24 NOTE — H&P (Signed)
[facility]   Primary Care Physician:  Venia Carbon, MD Ophthalmologist: Dr. Leandrew Koyanagi  Pre-Procedure History & Physical: HPI:  Robyn [place] is a 87 y.o. female here for ophthalmic surgery.   Past Medical History:  Diagnosis Date   Aortic valve insufficiency    Basal cell carcinoma 2014   Right Forehead, MOHS   Chronic venous insufficiency    Diverticulitis    hospitalized 2013   Hypothyroidism    IBS (irritable bowel syndrome)    Lumbar degenerative disc disease    Mood disorder ([place]) 2011   adjustment issues mostly to moving and husband's illness   Osteopenia    PSVT (paroxysmal supraventricular tachycardia) (HCC)    Raynaud's syndrome    Rosacea     Past Surgical History:  Procedure Laterality Date   MOHS SURGERY  2017   [facility]?   PILONIDAL CYST EXCISION  1958   TONSILLECTOMY AND ADENOIDECTOMY      Prior to Admission medications   Medication Sig Start Date End Date Taking? Authorizing Provider  Calcium Carbonate-Vit D-Min (CALCIUM 600+D PLUS MINERALS) 600-400 MG-UNIT TABS Take by mouth.   Yes [provider]  denosumab (PROLIA) 60 MG/ML SOSY injection Inject 60 mg into the skin every 6 (six) months.   Yes [provider]  doxycycline (PERIOSTAT) 20 MG tablet Take 1 tablet twice a day with food and drink 07/07/21  Yes Brendolyn Patty, MD  DULoxetine (CYMBALTA) 30 MG capsule Take 1 capsule (30 mg total) by mouth daily. 06/04/21  Yes Venia Carbon, MD  fexofenadine (ALLEGRA) 180 MG tablet Take 180 mg by mouth as needed.   Yes [provider]  levothyroxine (SYNTHROID) 75 MCG tablet TAKE 1 TABLET BY MOUTH  DAILY 05/12/21  Yes Venia Carbon, MD  Multiple Vitamins-Minerals (CENTRUM SILVER) tablet Take by mouth. 01/09/10  Yes [provider]  omeprazole (PRILOSEC) 20 MG capsule Take 1 capsule (20 mg total) by mouth daily as needed. 02/06/21  Yes Lucilla Lame, MD  timolol (TIMOPTIC) 0.5 % ophthalmic solution   02/27/16  Yes [provider]  triamcinolone cream (KENALOG) 0.1 % Apply to itchy rash 1-2 times daily until improved. Avoid face, groin, underarms. 07/03/19  Yes Brendolyn Patty, MD  doxycycline (ADOXA) 100 MG tablet TAKE ONE TABLET BY MOUTH DAILY FOR ROSACEA FLARES 09/15/21   Brendolyn Patty, MD  famotidine (PEPCID) 20 MG tablet Take 20 mg by mouth 2 (two) times daily as needed for heartburn or indigestion. Patient not taking: Reported on 04/23/2021    [provider]    Allergies as of 08/27/2021 - Review Complete 04/23/2021  Allergen Reaction Noted   Bioflavonoids Swelling 07/29/2012   Banana  01/22/2016   Barley grass Other (See Comments) 07/16/2015   Pear Other (See Comments) 07/16/2015   Pineapple  01/22/2016   Rice Other (See Comments) 07/29/2012   Rye grass flower pollen extract  [gramineae pollens] Other (See Comments) 07/16/2015   Wheat bran Other (See Comments) 07/16/2015   Hydroxychloroquine Rash 02/04/2017    Family History  Problem Relation Age of Onset   Diabetes Mother    Alcohol abuse Father    Diabetes Sister    Thyroid cancer Daughter     Social History   Socioeconomic History   Marital status: Widowed    Spouse name: Not on file   Number of children: 2   Years of education: Not on file   Highest education level: Not on file  Occupational History   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  Tobacco Use   Smoking status: Never   Smokeless tobacco: Never  Vaping Use   Vaping Use: Never used  Substance and Sexual Activity   Alcohol use: Never   Drug use: Never   Sexual activity: Not on file  Other Topics Concern   Not on file  Social History Narrative   2 daughters      Has living [place]   Daughter Benjamine Mola Warehouse manager) then [employer] ([place]) have health care POA   Would accept resuscitation but no prolonged ventilation or tube feeds   Social Determinants of Health   Financial Resource Strain: Not on file   Food Insecurity: Not on file  Transportation Needs: Not on file  Physical Activity: Not on file  Stress: Not on file  Social Connections: Not on file  Intimate Partner Violence: Not on file    Review of Systems: See HPI, otherwise negative ROS  Physical Exam: BP (!) 152/81   Pulse 70   Temp (!) 97.3 F (36.3 C) (Temporal)   Resp 13   Ht '5\' 6"'$  (1.676 m)   Wt 49 kg   SpO2 100%   BMI 17.43 kg/m  General:   Alert,  pleasant and cooperative in NAD Head:  Normocephalic and atraumatic. Lungs:  Clear to auscultation.    Heart:  Regular rate and rhythm.   Impression/Plan: Robyn [place] is here for ophthalmic surgery.  Risks, benefits, limitations, and alternatives regarding ophthalmic surgery have been reviewed with the patient.  Questions have been answered.  All parties agreeable.   Leandrew Koyanagi, MD  09/24/2021, 9:57 AM

## 2021-09-24 NOTE — Transfer of Care (Signed)
Immediate Anesthesia Transfer of Care Note  Patient: Robyn [place]  Procedure(s) Performed: CATARACT EXTRACTION PHACO AND INTRAOCULAR LENS PLACEMENT (IOC) LEFT VIVITY TORIC 15.68 02:02.5 (Left: Eye)  Patient Location: PACU  Anesthesia Type:MAC  Level of Consciousness: awake and alert   Airway & Oxygen Therapy: Patient Spontanous Breathing  Post-op Assessment: Report given to RN and Post -op Vital signs reviewed and stable  Post vital signs: Reviewed  Last Vitals:  Vitals Value Taken Time  BP    Temp    Pulse 72 09/24/21 1049  Resp    SpO2 98 % 09/24/21 1049  Vitals shown include unvalidated device data.  Last Pain:  Vitals:   09/24/21 0936  TempSrc: Temporal  PainSc: 0-No pain         Complications: No notable events documented.

## 2021-09-24 NOTE — Anesthesia Postprocedure Evaluation (Signed)
Anesthesia Post Note  Patient: Robyn [place]  Procedure(s) Performed: CATARACT EXTRACTION PHACO AND INTRAOCULAR LENS PLACEMENT (IOC) LEFT VIVITY TORIC 15.68 02:02.5 (Left: Eye)     Patient location during evaluation: PACU Anesthesia Type: MAC Level of consciousness: awake and alert Pain management: pain level controlled Vital Signs Assessment: post-procedure vital signs reviewed and stable Respiratory status: spontaneous breathing, nonlabored ventilation, respiratory function stable and patient connected to nasal cannula oxygen Cardiovascular status: stable and blood pressure returned to baseline Postop Assessment: no apparent nausea or vomiting Anesthetic complications: no   No notable events documented.  Molli Barrows

## 2021-09-25 ENCOUNTER — Encounter: Payer: Self-pay | Admitting: Ophthalmology

## 2021-10-01 NOTE — Anesthesia Preprocedure Evaluation (Addendum)
Anesthesia Evaluation  Patient identified by MRN, date of birth, ID band Patient awake    Reviewed: Allergy & Precautions, H&P , NPO status , Patient's Chart, lab work & pertinent test results, reviewed documented beta blocker date and time   Airway Mallampati: II  TM Distance: >3 FB Neck ROM: full    Dental no notable dental hx. (+) Teeth Intact   Pulmonary neg pulmonary ROS,    Pulmonary exam normal breath sounds clear to auscultation       Cardiovascular Exercise Tolerance: Good  Rhythm:regular Rate:Normal  PSVT (paroxysmal supraventricular tachycardia  Aortic valve insufficiency  Raynaud's syndrome   Neuro/Psych PSYCHIATRIC DISORDERS negative neurological ROS     GI/Hepatic negative GI ROS, Neg liver ROS,   Endo/Other  Hypothyroidism   Renal/GU      Musculoskeletal  (+) Arthritis ,   Abdominal Normal abdominal exam  (+)   Peds  Hematology negative hematology ROS (+)   Anesthesia Other Findings   Reproductive/Obstetrics negative OB ROS                           Anesthesia Physical  Anesthesia Plan  ASA: 2  Anesthesia Plan: MAC   Post-op Pain Management:    Induction: Intravenous  PONV Risk Score and Plan:   Airway Management Planned: Natural Airway  Additional Equipment:   Intra-op Plan:   Post-operative Plan:   Informed Consent: I have reviewed the patients History and Physical, chart, labs and discussed the procedure including the risks, benefits and alternatives for the proposed anesthesia with the patient or authorized representative who has indicated his/her understanding and acceptance.       Plan Discussed with: CRNA  Anesthesia Plan Comments:       Anesthesia Quick Evaluation

## 2021-10-01 NOTE — Discharge Instructions (Signed)

## 2021-10-06 ENCOUNTER — Encounter: Payer: Self-pay | Admitting: Ophthalmology

## 2021-10-06 ENCOUNTER — Other Ambulatory Visit: Payer: Self-pay

## 2021-10-06 ENCOUNTER — Ambulatory Visit
Admission: RE | Admit: 2021-10-06 | Discharge: 2021-10-06 | Disposition: A | Discharge status: Home / Self Care | Payer: Medicare Other | Attending: Ophthalmology | Admitting: Ophthalmology

## 2021-10-06 ENCOUNTER — Ambulatory Visit (AMBULATORY_SURGERY_CENTER): Payer: Medicare Other | Admitting: Anesthesiology

## 2021-10-06 ENCOUNTER — Ambulatory Visit: Payer: Medicare Other | Admitting: Anesthesiology

## 2021-10-06 ENCOUNTER — Encounter
Admission: RE | Disposition: A | Discharge status: Home / Self Care | Payer: Self-pay | Source: Home / Self Care | Attending: Ophthalmology

## 2021-10-06 DIAGNOSIS — H2511 Age-related nuclear cataract, right eye: Secondary | ICD-10-CM | POA: Diagnosis present

## 2021-10-06 DIAGNOSIS — E039 Hypothyroidism, unspecified: Secondary | ICD-10-CM | POA: Diagnosis not present

## 2021-10-06 DIAGNOSIS — I351 Nonrheumatic aortic (valve) insufficiency: Secondary | ICD-10-CM | POA: Diagnosis not present

## 2021-10-06 DIAGNOSIS — I7300 Raynaud's syndrome without gangrene: Secondary | ICD-10-CM

## 2021-10-06 HISTORY — PX: CATARACT EXTRACTION W/PHACO: SHX586

## 2021-10-06 SURGERY — PHACOEMULSIFICATION, CATARACT, WITH IOL INSERTION
Anesthesia: Monitor Anesthesia Care | Site: Eye | Laterality: Right

## 2021-10-06 MED ORDER — BRIMONIDINE TARTRATE-TIMOLOL 0.2-0.5 % OP SOLN
OPHTHALMIC | Status: DC | PRN
  Administered 2021-10-06: 1 [drp] via OPHTHALMIC

## 2021-10-06 MED ORDER — SIGHTPATH DOSE#1 NA HYALUR & NA CHOND-NA HYALUR IO KIT
PACK | INTRAOCULAR | Status: DC | PRN
  Administered 2021-10-06: 1 via OPHTHALMIC

## 2021-10-06 MED ORDER — MIDAZOLAM HCL 2 MG/2ML IJ SOLN
INTRAMUSCULAR | Status: DC | PRN
  Administered 2021-10-06: 2 mg via INTRAVENOUS

## 2021-10-06 MED ORDER — CEFUROXIME OPHTHALMIC INJECTION 1 MG/0.1 ML
INJECTION | OPHTHALMIC | Status: DC | PRN
  Administered 2021-10-06: 0.1 mL via INTRACAMERAL

## 2021-10-06 MED ORDER — ARMC OPHTHALMIC DILATING DROPS
1.0000 | OPHTHALMIC | Status: DC | PRN
  Administered 2021-10-06 (×3): 1 via OPHTHALMIC

## 2021-10-06 MED ORDER — FENTANYL CITRATE (PF) 100 MCG/2ML IJ SOLN
INTRAMUSCULAR | Status: DC | PRN
  Administered 2021-10-06: 50 ug via INTRAVENOUS

## 2021-10-06 MED ORDER — TETRACAINE HCL 0.5 % OP SOLN
1.0000 [drp] | OPHTHALMIC | Status: DC | PRN
  Administered 2021-10-06 (×3): 1 [drp] via OPHTHALMIC

## 2021-10-06 MED ORDER — SIGHTPATH DOSE#1 BSS IO SOLN
INTRAOCULAR | Status: DC | PRN
  Administered 2021-10-06: 15 mL

## 2021-10-06 MED ORDER — SIGHTPATH DOSE#1 BSS IO SOLN
INTRAOCULAR | Status: DC | PRN
  Administered 2021-10-06: 1 mL via INTRAMUSCULAR

## 2021-10-06 MED ORDER — SIGHTPATH DOSE#1 BSS IO SOLN
INTRAOCULAR | Status: DC | PRN
  Administered 2021-10-06: 75 mL via OPHTHALMIC

## 2021-10-06 SURGICAL SUPPLY — 13 items
CATARACT SUITE SIGHTPATH (MISCELLANEOUS) ×2 IMPLANT
FEE CATARACT SUITE SIGHTPATH (MISCELLANEOUS) ×1 IMPLANT
GLOVE SRG 8 PF TXTR STRL LF DI (GLOVE) ×1 IMPLANT
GLOVE SURG ENC TEXT LTX SZ7.5 (GLOVE) ×2 IMPLANT
GLOVE SURG UNDER POLY LF SZ8 (GLOVE) ×2
LENS IOL ACRSF VT TRC 315 20.0 IMPLANT
LENS IOL ACRYSOF VIVITY 20.0 ×2 IMPLANT
LENS IOL VIVITY 315 20.0 ×1 IMPLANT
NDL FILTER BLUNT 18X1 1/2 (NEEDLE) ×1 IMPLANT
NEEDLE FILTER BLUNT 18X 1/2SAF (NEEDLE) ×1
NEEDLE FILTER BLUNT 18X1 1/2 (NEEDLE) ×1 IMPLANT
SYR 3ML LL SCALE MARK (SYRINGE) ×2 IMPLANT
WATER STERILE IRR 250ML POUR (IV SOLUTION) ×2 IMPLANT

## 2021-10-06 NOTE — Anesthesia Postprocedure Evaluation (Signed)
Anesthesia Post Note  Patient: Robyn [place]  Procedure(s) Performed: CATARACT EXTRACTION PHACO AND INTRAOCULAR LENS PLACEMENT (IOC) RIGHT VIVITY TORIC 24.29 02:02.7 (Right: Eye)     Patient location during evaluation: PACU Anesthesia Type: MAC Level of consciousness: awake and alert Pain management: pain level controlled Vital Signs Assessment: post-procedure vital signs reviewed and stable Respiratory status: spontaneous breathing, nonlabored ventilation and respiratory function stable Cardiovascular status: blood pressure returned to baseline and stable Postop Assessment: no apparent nausea or vomiting Anesthetic complications: no   No notable events documented.  [place] Ouch

## 2021-10-06 NOTE — Transfer of Care (Signed)
Immediate Anesthesia Transfer of Care Note  Patient: Robyn [place]  Procedure(s) Performed: CATARACT EXTRACTION PHACO AND INTRAOCULAR LENS PLACEMENT (IOC) RIGHT VIVITY TORIC 24.29 02:02.7 (Right: Eye)  Patient Location: PACU  Anesthesia Type: MAC  Level of Consciousness: awake, alert  and patient cooperative  Airway and Oxygen Therapy: Patient Spontanous Breathing and Patient connected to supplemental oxygen  Post-op Assessment: Post-op Vital signs reviewed, Patient's Cardiovascular Status Stable, Respiratory Function Stable, Patent Airway and No signs of Nausea or vomiting  Post-op Vital Signs: Reviewed and stable  Complications: No notable events documented.

## 2021-10-06 NOTE — Op Note (Signed)
LOCATION:  [place]   PREOPERATIVE DIAGNOSIS:  Nuclear sclerotic cataract of the right eye.  H25.11   POSTOPERATIVE DIAGNOSIS:  Nuclear sclerotic cataract of the right eye.   PROCEDURE:  Phacoemulsification with Toric posterior chamber intraocular lens placement of the right eye.  Ultrasound time: Procedure(s): CATARACT EXTRACTION PHACO AND INTRAOCULAR LENS PLACEMENT (IOC) RIGHT VIVITY TORIC 24.29 02:02.7 (Right)  LENS:   Implant Name Type Inv. Item Serial No. Manufacturer Lot No. LRB No. Used Action  LENS IOL ACRYSOF VIVITY 20.0 - [id number]  LENS IOL ACRYSOF VIVITY 20.0 [id number] [employer]  Right 1 Implanted     [id number] Vivity Toric intraocular lens with 1.5 diopters of cylindrical power with axis orientation at 9 degrees.   SURGEON:  Wyonia Hough, MD   ANESTHESIA: Topical with tetracaine drops and 2% Xylocaine jelly, augmented with 1% preservative-free intracameral lidocaine. .   COMPLICATIONS:  None.   DESCRIPTION OF PROCEDURE:  The patient was identified in the holding room and transported to the operating suite and placed in the supine position under the operating microscope.  The right eye was identified as the operative eye, and it was prepped and draped in the usual sterile ophthalmic fashion.    A clear-corneal paracentesis incision was made at the 12:00 position.  0.5 ml of preservative-free 1% lidocaine was injected into the anterior chamber. The anterior chamber was filled with Viscoat.  A 2.4 millimeter near clear corneal incision was then made at the 9:00 position.  A cystotome and capsulorrhexis forceps were then used to make a curvilinear capsulorrhexis.  Hydrodissection and hydrodelineation were then performed using balanced salt solution.   Phacoemulsification was then used in stop and chop fashion to remove the lens, nucleus and epinucleus.  The remaining cortex was aspirated using the irrigation and aspiration handpiece.  Provisc viscoelastic  was then placed into the capsular bag to distend it for lens placement.  The Verion digital marker was used to align the implant at the intended axis.   A Toric lens was then injected into the capsular bag.  It was rotated clockwise until the axis marks on the lens were approximately 15 degrees in the counterclockwise direction to the intended alignment.  The viscoelastic was aspirated from the eye using the irrigation aspiration handpiece.  Then, a Koch spatula through the sideport incision was used to rotate the lens in a clockwise direction until the axis markings of the intraocular lens were lined up with the Verion alignment.  Balanced salt solution was then used to hydrate the wounds. Cefuroxime 0.1 ml of a '10mg'$ /ml solution was injected into the anterior chamber for a dose of 1 mg of intracameral antibiotic at the completion of the case.    The eye was noted to have a physiologic pressure and there was no wound leak noted.   Timolol and Brimonidine drops were applied to the eye.  The patient was taken to the recovery room in stable condition having had no complications of anesthesia or surgery.  Cormick Moss 10/06/2021, 1:17 PM

## 2021-10-06 NOTE — H&P (Signed)
[facility]   Primary Care Physician:  Venia Carbon, MD Ophthalmologist: Dr. Leandrew Koyanagi  Pre-Procedure History & Physical: HPI:  Robyn Washington is a 87 y.o. female here for ophthalmic surgery.   Past Medical History:  Diagnosis Date   Aortic valve insufficiency    Basal cell carcinoma 2014   Right Forehead, MOHS   Chronic venous insufficiency    Diverticulitis    hospitalized 2013   Hypothyroidism    IBS (irritable bowel syndrome)    Lumbar degenerative disc disease    Mood disorder ([place]) 2011   adjustment issues mostly to moving and husband's illness   Osteopenia    PSVT (paroxysmal supraventricular tachycardia) ([place])    Raynaud's syndrome    Rosacea     Past Surgical History:  Procedure Laterality Date   CATARACT EXTRACTION W/PHACO Left 09/24/2021   Procedure: CATARACT EXTRACTION PHACO AND INTRAOCULAR LENS PLACEMENT ([place]) LEFT VIVITY TORIC 15.68 02:02.5;  Surgeon: Leandrew Koyanagi, MD;  Location: [place];  Service: Ophthalmology;  Laterality: Left;   MOHS SURGERY  2017   BSC?   PILONIDAL CYST EXCISION  1958   TONSILLECTOMY AND ADENOIDECTOMY      Prior to Admission medications   Medication Sig Start Date End Date Taking? Authorizing Provider  Calcium Carbonate-Vit D-Min (CALCIUM 600+D PLUS MINERALS) 600-400 MG-UNIT TABS Take by mouth.   Yes [provider]  doxycycline (ADOXA) 100 MG tablet TAKE ONE TABLET BY MOUTH DAILY FOR ROSACEA FLARES 09/15/21  Yes Brendolyn Patty, MD  DULoxetine (CYMBALTA) 30 MG capsule Take 1 capsule (30 mg total) by mouth daily. 06/04/21  Yes Venia Carbon, MD  fexofenadine (ALLEGRA) 180 MG tablet Take 180 mg by mouth as needed.   Yes [provider]  levothyroxine (SYNTHROID) 75 MCG tablet TAKE 1 TABLET BY MOUTH  DAILY 05/12/21  Yes Venia Carbon, MD  Multiple Vitamins-Minerals (CENTRUM SILVER) tablet Take by mouth. 01/09/10  Yes [provider]  omeprazole (PRILOSEC) 20 MG  capsule Take 1 capsule (20 mg total) by mouth daily as needed. 02/06/21  Yes Lucilla Lame, MD  denosumab (PROLIA) 60 MG/ML SOSY injection Inject 60 mg into the skin every 6 (six) months.    [provider]  doxycycline (PERIOSTAT) 20 MG tablet Take 1 tablet twice a day with food and drink 07/07/21   Brendolyn Patty, MD  famotidine (PEPCID) 20 MG tablet Take 20 mg by mouth 2 (two) times daily as needed for heartburn or indigestion. Patient not taking: Reported on 04/23/2021    [provider]  timolol (TIMOPTIC) 0.5 % ophthalmic solution  02/27/16   [provider]  triamcinolone cream (KENALOG) 0.1 % Apply to itchy rash 1-2 times daily until improved. Avoid face, groin, underarms. 07/03/19   Brendolyn Patty, MD    Allergies as of 08/27/2021 - Review Complete 04/23/2021  Allergen Reaction Noted   Bioflavonoids Swelling 07/29/2012   Banana  01/22/2016   Barley grass Other (See Comments) 07/16/2015   Pear Other (See Comments) 07/16/2015   Pineapple  01/22/2016   Rice Other (See Comments) 07/29/2012   Rye grass flower pollen extract  [gramineae pollens] Other (See Comments) 07/16/2015   Wheat bran Other (See Comments) 07/16/2015   Hydroxychloroquine Rash 02/04/2017    Family History  Problem Relation Age of Onset   Diabetes Mother    Alcohol abuse Father    Diabetes Sister    Thyroid cancer Daughter     Social History   Socioeconomic History  Marital status: Widowed    Spouse name: Not on file   Number of children: 2   Years of education: Not on file   Highest education level: Not on file  Occupational History   Occupation: Web designer    Comment: Manufacturing systems engineer  Tobacco Use   Smoking status: Never   Smokeless tobacco: Never  Vaping Use   Vaping Use: Never used  Substance and Sexual Activity   Alcohol use: Never   Drug use: Never   Sexual activity: Not on file  Other Topics Concern   Not on file  Social History Narrative   2  daughters      Has living will   Daughter Benjamine Mola Warehouse manager) then [employer] ([place]) have health care POA   Would accept resuscitation but no prolonged ventilation or tube feeds   Social Determinants of Health   Financial Resource Strain: Not on file  Food Insecurity: Not on file  Transportation Needs: Not on file  Physical Activity: Not on file  Stress: Not on file  Social Connections: Not on file  Intimate Partner Violence: Not on file    Review of Systems: See HPI, otherwise negative ROS  Physical Exam: BP (!) 158/68   Pulse 69   Temp (!) 97.2 F (36.2 C) (Temporal)   Ht '5\' 6"'$  (1.676 m)   Wt 48.5 kg   SpO2 96%   BMI 17.27 kg/m  General:   Alert,  pleasant and cooperative in NAD Head:  Normocephalic and atraumatic. Lungs:  Clear to auscultation.    Heart:  Regular rate and rhythm.   Impression/Plan: Robyn Washington is here for ophthalmic surgery.  Risks, benefits, limitations, and alternatives regarding ophthalmic surgery have been reviewed with the patient.  Questions have been answered.  All parties agreeable.   Leandrew Koyanagi, MD  10/06/2021, 12:29 PM

## 2021-10-07 ENCOUNTER — Encounter: Payer: Self-pay | Admitting: Ophthalmology

## 2021-10-24 ENCOUNTER — Ambulatory Visit: Payer: Medicare Other | Admitting: Physician Assistant

## 2021-11-03 NOTE — Progress Notes (Unsigned)
Cardiology Office Note    Date:  11/04/2021   ID:  Robyn Washington, DOB April 11, 1934, MRN [id number]  PCP:  Venia Carbon, MD  Cardiologist:  Ida Rogue, MD  Electrophysiologist:  None   Chief Complaint: Follow-up  History of Present Illness:   Robyn Washington is a 87 y.o. female with history of SVT, mild to moderate aortic insufficiency, near syncope felt to be related to a combination of orthostasis and/or vasovagal, chronic venous insufficiency, Raynaud's, osteoporosis, and nutcracker esophagus who presents for follow-up of Zio patch.  She was previously followed by [facility] cardiology for near syncope felt to be related to a combination of orthostasis and/or vasovagal episode, SVT, and aortic insufficiency.  Documentation also has indicated she reported a normal stress test in the remote past in [place].   Prior cardiac work-up:  2D echo in 02/2016 showed a normal EF, mild aortic insufficiency, mild to moderate tricuspid regurgitation, and RV dilatation.  Zio patch in 05/2016 showed a predominant rhythm of sinus with rates ranging from 52 to 171 bpm with an average rate of 84 bpm, 2.1% PVC burden, 8 episodes of SVT felt to likely be atrial tachycardia with the longest episode lasting 10 beats with an average rate of 102 bpm, 1 episode of WCT lasting 7 beats, symptoms correlated with SVT.  Venous duplex 05/2016 with bilateral chronic venous insufficiency.  Echo in 03/2017 demonstrated an EF greater than 55%, mild mitral regurgitation, aortic valve sclerosis, mild to moderate aortic insufficiency, normal RV systolic function, mild to moderate tricuspid gravitation, and mildly elevated PASP.  Echo in 10/2019 demonstrated an EF of greater than 55%, normal LV cavity size and wall thickness, normal RV systolic function and ventricular cavity size, mild to moderate aortic insufficiency, mild to moderate tricuspid regurgitation, and an estimated right atrial pressure of 5 to 10  mmHg.  She established care with Dr. Rockey Situ in 04/2020 to be closer to home.  At that time, she was exercising regularly and was without symptoms of angina or decompensation.  She reported shortness of breath with getting out of bed, not with exercise.  She was last seen in our office in 04/2021 noting an increase in fatigue and shortness of breath along with tachypalpitations.  Apple watch was recommended.  She contacted our office in 07/2021 with near syncope upon getting out of bed with associated diaphoresis.  Heart rate of 51 bpm with a BP of 104/57, though prior documentation indicates this was "hours later."  Subsequent Zio patch in 07/2021 showed a predominant rhythm of sinus with an average rate of 80 bpm (range 48 to 179 bpm), 39 episodes of SVT/atrial tachycardia with the fastest interval lasting 11 beats with a maximal rate of 179 bpm and the longest interval lasting 15.9 seconds with an average rate of 106 bpm, occasional PACs representing a 1.8% burden, rare atrial couplets and triplets, rare PVCs, ventricular couplets, and ventricular triplets.  Patient triggered events were associated with sinus tachycardia.  She comes in today and is doing reasonably well from a cardiac perspective, without symptoms of angina or decompensation.  She does continue to note fatigue and lightheadedness, particularly first thing in the morning when getting up.  At times, she also notes intermittent palpitations that are also associated with fatigue and lightheadedness.  These symptoms correlated with episodes of SVT at times on her outpatient cardiac monitor.  Throughout the day, she remains fairly active walking on her treadmill at a flat incline, using a recumbent bike, and rowing  machine.  With these activities, she is largely asymptomatic.  Blood pressure is mildly elevated in the office today, though this is typically well controlled in the low 630Z systolic.  She has pushed back the time she uses her timolol drops  to later in the morning to see if this will help with her fatigue and lightheadedness.  With this, she has not noticed much change in symptoms.  No significant lower extremity swelling or orthopnea.  She tries to maintain adequate hydration.   Labs independently reviewed: 02/2021 - Hgb 13.0, PLT 189, TSH normal, potassium 4.0, BUN 19, serum creatinine 0.62, albumin 4.5, AST/ALT normal 10/2020 - TC 197, TG 86, HDL 78, LDL 94  Past Medical History:  Diagnosis Date   Aortic valve insufficiency    Basal cell carcinoma 2014   Right Forehead, MOHS   Chronic venous insufficiency    Diverticulitis    hospitalized 2013   Hypothyroidism    IBS (irritable bowel syndrome)    Lumbar degenerative disc disease    Mood disorder ([place]) 2011   adjustment issues mostly to moving and husband's illness   Osteopenia    PSVT (paroxysmal supraventricular tachycardia) ([place])    Raynaud's syndrome    Rosacea     Past Surgical History:  Procedure Laterality Date   CATARACT EXTRACTION W/PHACO Left 09/24/2021   Procedure: CATARACT EXTRACTION PHACO AND INTRAOCULAR LENS PLACEMENT ([place]) LEFT VIVITY TORIC 15.68 02:02.5;  Surgeon: Leandrew Koyanagi, MD;  Location: [facility];  Service: Ophthalmology;  Laterality: Left;   CATARACT EXTRACTION W/PHACO Right 10/06/2021   Procedure: CATARACT EXTRACTION PHACO AND INTRAOCULAR LENS PLACEMENT ([place]) RIGHT VIVITY TORIC 24.29 02:02.7;  Surgeon: Leandrew Koyanagi, MD;  Location: [place];  Service: Ophthalmology;  Laterality: Right;   MOHS SURGERY  2017   BSC?   PILONIDAL CYST EXCISION  1958   TONSILLECTOMY AND ADENOIDECTOMY      Current Medications: Current Meds  Medication Sig   Calcium Carbonate-Vit D-Min (CALCIUM 600+D PLUS MINERALS) 600-400 MG-UNIT TABS Take by mouth.   denosumab (PROLIA) 60 MG/ML SOSY injection Inject 60 mg into the skin every 6 (six) months.   doxycycline (ADOXA) 100 MG tablet TAKE ONE TABLET BY MOUTH DAILY FOR ROSACEA FLARES    doxycycline (PERIOSTAT) 20 MG tablet Take 1 tablet twice a day with food and drink   DULoxetine (CYMBALTA) 30 MG capsule Take 1 capsule (30 mg total) by mouth daily.   fexofenadine (ALLEGRA) 180 MG tablet Take 180 mg by mouth as needed.   levothyroxine (SYNTHROID) 75 MCG tablet TAKE 1 TABLET BY MOUTH  DAILY   Multiple Vitamins-Minerals (CENTRUM SILVER) tablet Take by mouth.   omeprazole (PRILOSEC) 20 MG capsule Take 1 capsule (20 mg total) by mouth daily as needed.   timolol (TIMOPTIC) 0.5 % ophthalmic solution    triamcinolone cream (KENALOG) 0.1 % Apply to itchy rash 1-2 times daily until improved. Avoid face, groin, underarms.    Allergies:   Bioflavonoids, Banana, Barley grass, Pear, Pineapple, Rice, Rye grass flower pollen extract  [gramineae pollens], Wheat bran, and Hydroxychloroquine   Social History   Socioeconomic History   Marital status: Widowed    Spouse name: Not on file   Number of children: 2   Years of education: Not on file   Highest education level: Not on file  Occupational History   Occupation: Web designer    Comment: Manufacturing systems engineer  Tobacco Use   Smoking status: Never   Smokeless tobacco: Never  Vaping Use  Vaping Use: Never used  Substance and Sexual Activity   Alcohol use: Never   Drug use: Never   Sexual activity: Not on file  Other Topics Concern   Not on file  Social History Narrative   2 daughters      Has living will   Daughter Benjamine Mola Warehouse manager) then Ivin Booty [place]) have health care POA   Would accept resuscitation but no prolonged ventilation or tube feeds   Social Determinants of Health   Financial Resource Strain: Not on file  Food Insecurity: Not on file  Transportation Needs: Not on file  Physical Activity: Not on file  Stress: Not on file  Social Connections: Not on file     Family History:  The patient's family history includes Alcohol abuse in her father; Diabetes in her mother and sister; Thyroid  cancer in her daughter.  ROS:   12-point review of systems is negative unless otherwise noted in HPI.   EKGs/Labs/Other Studies Reviewed:    Studies reviewed were summarized above. The additional studies were reviewed today:  Zio patch 07/2021: Patch Wear Time:  14 days and 0 hours (2023-07-03T10:20:14-0400 to 2023-07-17T10:20:18-0400)   Normal sinus rhythm Patient had a min HR of 48 bpm, max HR of 179 bpm, and avg HR of 80 bpm.   39 Supraventricular Tachycardia/atrial tachycardia runs occurred, the run with the fastest interval lasting 11 beats with a max rate of 179 bpm, the longest lasting 15.9 secs with an avg rate of 106 bpm.    Isolated SVEs were occasional (1.8%, 13244), SVE Couplets were rare (<1.0%, 191), and SVE Triplets were rare (<1.0%, 204).  Isolated VEs were rare (<1.0%, 53), VE Couplets were rare (<1.0%, 3), and VE Triplets were rare (<1.0%, 1).    Triggered events (7) associated with sinus tachycardia __________  2D echo 11/06/2019 [facility]):   1. The left ventricle is normal in size with normal wall thickness.    2. The left ventricular systolic function is normal, LVEF is visually  estimated at > 55%.    3. The aortic valve is trileaflet with mildly thickened leaflets with normal  excursion.    4. There is mild to moderate aortic regurgitation.    5. The right ventricle is normal in size, with normal systolic function.    6. There is mild to moderate tricuspid regurgitation.    7. IVC size and inspiratory change suggest mildly elevated right atrial  pressure. (5-10 mmHg).  __________  2D echo 04/19/2017 [facility]):  Mildly elevated right atrial pressure   Normal left ventricular systolic function, ejection fraction > 55%   Mitral regurgitation - mild   Aortic sclerosis   Aortic regurgitation - mild to moderate   Normal right ventricular systolic function   Tricuspid regurgitation - mild to moderate   Elevated pulmonary artery systolic pressure - mild   __________  2D echo 03/11/2016 [facility]): Normal left ventricular systolic function, ejection fraction 60 to 65%   Aortic regurgitation - mild   Dilated right ventricle   Normal right ventricular systolic function   Tricuspid regurgitation - mild to moderate   EKG:  EKG is ordered today.  The EKG ordered today demonstrates sinus bradycardia, 58 bpm, no acute ST-T changes  Recent Labs: 02/25/2021: ALT 29; BUN 19; Creatinine, Ser 0.62; Hemoglobin 13.0; Platelets 189.0; Potassium 4.0; Sodium 140; TSH 0.96  Recent Lipid Panel    Component Value Date/Time   CHOL 187 11/12/2020 1137   TRIG 86 11/12/2020 1137   HDL 78 11/12/2020  1137   CHOLHDL 2.4 11/12/2020 1137   LDLCALC 94 11/12/2020 1137    PHYSICAL EXAM:    VS:  BP (!) 158/80 (BP Location: Left Arm, Patient Position: Sitting, Cuff Size: Normal)   Pulse (!) 58   Ht 5' 5.5" (1.664 m)   Wt 110 lb 3.2 oz (50 kg)   SpO2 100%   BMI 18.06 kg/m   BMI: Body mass index is 18.06 kg/m.  Physical Exam Vitals reviewed.  Constitutional:      Appearance: She is well-developed.  HENT:     Head: Normocephalic and atraumatic.  Eyes:     General:        Right eye: No discharge.        Left eye: No discharge.  Neck:     Vascular: No JVD.  Cardiovascular:     Rate and Rhythm: Normal rate and regular rhythm.     Heart sounds: S1 normal and S2 normal. Heart sounds not distant. No midsystolic click and no opening snap. Murmur heard.     High-pitched blowing decrescendo early diastolic murmur is present with a grade of 1/4 at the upper right sternal border radiating to the apex.     No friction rub.  Pulmonary:     Effort: Pulmonary effort is normal. No respiratory distress.     Breath sounds: Normal breath sounds. No decreased breath sounds, wheezing or rales.  Chest:     Chest wall: No tenderness.  Abdominal:     General: There is no distension.  Musculoskeletal:     Cervical back: Normal range of motion.  Skin:    General: Skin is  warm and dry.     Nails: There is no clubbing.  Neurological:     Mental Status: She is alert and oriented to person, place, and time.  Psychiatric:        Speech: Speech normal.        Behavior: Behavior normal.        Thought Content: Thought content normal.        Judgment: Judgment normal.     Wt Readings from Last 3 Encounters:  11/04/21 110 lb 3.2 oz (50 kg)  10/06/21 107 lb (48.5 kg)  09/24/21 108 lb (49 kg)     ASSESSMENT & PLAN:   Paroxysmal SVT: At times, episodes of PSVT corresponded with near syncope/fatigue/lightheadedness as noted on cardiac monitor.  Defer addition of standing beta-blocker at this time, which is also her preference, given low normal blood pressures at baseline and in the context of mild bradycardia in the office today.  Discussed vagal maneuvers.  Adequate hydration is recommended.  No indication for EP referral at this time for consideration of AAT or ablation.  Near syncope: Longstanding issue and more noticeable in the mornings after first getting up.  At times the symptoms are associated with palpitations and correlate with SVT on outpatient cardiac monitor.  Low heart rate of 48 bpm occurred at 10:29 PM, patient indicates she may have been asleep at that time.  Recommend slow positional changes and adequate fluid intake.  We will defer addition of beta-blocker at this time, which is also her preference, given history of low normal blood pressure and with a heart rate of 58 bpm in the office today.  Aortic valve insufficiency: Stable with mild to moderate insufficiency by echo [facility] in 2021 when compared to study in 2019.  Aorta noted to be normal in size.  Update echo to trend aortic  insufficiency and evaluate for any new structural abnormalities which may be contributing to her near syncope/lightheadedness/fatigue.  Venous insufficiency: Stable.   Disposition: F/u with Dr. Rockey Situ in 3 months.   Medication Adjustments/Labs and Tests  Ordered: Current medicines are reviewed at length with the patient today.  Concerns regarding medicines are outlined above. Medication changes, Labs and Tests ordered today are summarized above and listed in the Patient Instructions accessible in Encounters.   Signed, Christell Faith, PA-C 11/04/2021 11:55 AM     [facility] [address] [place], [place] [address] [phone]

## 2021-11-04 ENCOUNTER — Ambulatory Visit: Payer: Medicare Other | Attending: Physician Assistant | Admitting: Physician Assistant

## 2021-11-04 ENCOUNTER — Encounter: Payer: Self-pay | Admitting: Physician Assistant

## 2021-11-04 VITALS — BP 158/80 | HR 58 | Ht 65.5 in | Wt 110.2 lb

## 2021-11-04 DIAGNOSIS — I351 Nonrheumatic aortic (valve) insufficiency: Secondary | ICD-10-CM | POA: Diagnosis present

## 2021-11-04 DIAGNOSIS — R55 Syncope and collapse: Secondary | ICD-10-CM | POA: Insufficient documentation

## 2021-11-04 DIAGNOSIS — I471 Supraventricular tachycardia: Secondary | ICD-10-CM

## 2021-11-04 NOTE — Patient Instructions (Signed)
Medication Instructions:  Your physician recommends that you continue on your current medications as directed. Please refer to the Current Medication list given to you today.  *If you need a refill on your cardiac medications before your next appointment, please call your pharmacy*   Lab Work: None  If you have labs (blood work) drawn today and your tests are completely normal, you will receive your results only by: [facility] (if you have MyChart) OR A paper copy in the mail If you have any lab test that is abnormal or we need to change your treatment, we will call you to review the results.   Testing/Procedures: Your physician has requested that you have an echocardiogram. Echocardiography is a painless test that uses sound waves to create images of your heart. It provides your doctor with information about the size and shape of your heart and how well your heart's chambers and valves are working. This procedure takes approximately one hour. There are no restrictions for this procedure.    Follow-Up: At [facility], you and your health needs are our priority.  As part of our continuing mission to provide you with exceptional heart care, we have created designated Provider Care Teams.  These Care Teams include your primary Cardiologist (physician) and Advanced Practice Providers (APPs -  Physician Assistants and Nurse Practitioners) who all work together to provide you with the care you need, when you need it.   Your next appointment:   3 month(s)  The format for your next appointment:   In Person  Provider:   Ida Rogue, MD Only        Important Information About Sugar

## 2021-11-26 ENCOUNTER — Ambulatory Visit: Payer: Medicare Other | Attending: Physician Assistant

## 2021-11-26 DIAGNOSIS — I351 Nonrheumatic aortic (valve) insufficiency: Secondary | ICD-10-CM | POA: Insufficient documentation

## 2021-11-26 LAB — ECHOCARDIOGRAM COMPLETE
AV Mean grad: 3 mmHg
AV Peak grad: 6.4 mmHg
AV Vena cont: 0.6 cm
Ao pk vel: 1.26 m/s
Area-P 1/2: 4.65 cm2
Calc EF: 68.8 %
P 1/2 time: 537 msec
S' Lateral: 2.6 cm
Single Plane A2C EF: 68.1 %
Single Plane A4C EF: 68.2 %

## 2021-12-16 ENCOUNTER — Ambulatory Visit (INDEPENDENT_AMBULATORY_CARE_PROVIDER_SITE_OTHER): Payer: Medicare Other | Admitting: Internal Medicine

## 2021-12-16 ENCOUNTER — Encounter: Payer: Self-pay | Admitting: Internal Medicine

## 2021-12-16 VITALS — BP 138/84 | HR 68 | Temp 97.4°F | Ht 65.5 in | Wt 111.0 lb

## 2021-12-16 DIAGNOSIS — R310 Gross hematuria: Secondary | ICD-10-CM

## 2021-12-16 DIAGNOSIS — R3915 Urgency of urination: Secondary | ICD-10-CM

## 2021-12-16 DIAGNOSIS — R319 Hematuria, unspecified: Secondary | ICD-10-CM | POA: Insufficient documentation

## 2021-12-16 LAB — POC URINALSYSI DIPSTICK (AUTOMATED)
Bilirubin, UA: NEGATIVE
Glucose, UA: NEGATIVE
Ketones, UA: NEGATIVE
Leukocytes, UA: NEGATIVE
Nitrite, UA: NEGATIVE
Protein, UA: NEGATIVE
Spec Grav, UA: 1.01 (ref 1.010–1.025)
Urobilinogen, UA: 0.2 E.U./dL
pH, UA: 7 (ref 5.0–8.0)

## 2021-12-16 MED ORDER — SULFAMETHOXAZOLE-TRIMETHOPRIM 400-80 MG PO TABS: 1.0000 | ORAL_TABLET | Freq: Two times a day (BID) | ORAL | 1 refills | Status: DC

## 2021-12-16 NOTE — Assessment & Plan Note (Signed)
Seems to have ongoing symptoms--but urinalysis negative for leuk/nitrite Will try septra SS bid for 3 days just in case

## 2021-12-16 NOTE — Assessment & Plan Note (Signed)
Persistent brown tinged urine Normal creatinine last month Does need evaluation--will send to urology

## 2021-12-16 NOTE — Addendum Note (Signed)
Addended by: Pilar Grammes on: 12/16/2021 09:55 AM   Modules accepted: Orders

## 2021-12-16 NOTE — Progress Notes (Signed)
Subjective:    Patient ID: Robyn Washington, female    DOB: 28-Nov-1934, 87 y.o.   MRN: [id number]  HPI Here due to urinary problems  "I feel unwell" She feels she is retaining urine Goes a lot when it finally starts Gets some urgency--then can't go Gets some aching along lower back Feels swollen and discomfort in suprapubic area  Had urinalysis last month with rheum---dip positive bood/5 RBC only though  Then had some brown tinged spotting--on underwear (so started with panty liners) Variable day to day No dysuria--but does feel "irritation" even when not urinating  Current Outpatient Medications on File Prior to Visit  Medication Sig Dispense Refill   Calcium Carbonate-Vit D-Min (CALCIUM 600+D PLUS MINERALS) 600-400 MG-UNIT TABS Take by mouth.     denosumab (PROLIA) 60 MG/ML SOSY injection Inject 60 mg into the skin every 6 (six) months.     doxycycline (ADOXA) 100 MG tablet TAKE ONE TABLET BY MOUTH DAILY FOR ROSACEA FLARES 30 tablet 5   doxycycline (PERIOSTAT) 20 MG tablet Take 1 tablet twice a day with food and drink 180 tablet 3   DULoxetine (CYMBALTA) 30 MG capsule Take 1 capsule (30 mg total) by mouth daily. 90 capsule 3   famotidine (PEPCID) 20 MG tablet Take 20 mg by mouth 2 (two) times daily as needed for heartburn or indigestion.     fexofenadine (ALLEGRA) 180 MG tablet Take 180 mg by mouth as needed.     levothyroxine (SYNTHROID) 75 MCG tablet TAKE 1 TABLET BY MOUTH  DAILY 90 tablet 3   Multiple Vitamins-Minerals (CENTRUM SILVER) tablet Take by mouth.     omeprazole (PRILOSEC) 20 MG capsule Take 1 capsule (20 mg total) by mouth daily as needed. 90 capsule 3   timolol (TIMOPTIC) 0.5 % ophthalmic solution      triamcinolone cream (KENALOG) 0.1 % Apply to itchy rash 1-2 times daily until improved. Avoid face, groin, underarms. 240 g 0   No current facility-administered medications on file prior to visit.    Allergies  Allergen Reactions   Bioflavonoids Swelling     Swelling of lips    Banana    Barley Grass Other (See Comments)    Other reaction(s):   Pear Other (See Comments)    Other reaction(s): Feels bad    Pineapple    Rice Other (See Comments)    Rhinitis    Rye Grass Flower Pollen Extract  [Gramineae Pollens] Other (See Comments)   Wheat Bran Other (See Comments)   Hydroxychloroquine Nausea Only and Rash    Lightheadedness    Past Medical History:  Diagnosis Date   Aortic valve insufficiency    Basal cell carcinoma 2014   Right Forehead, MOHS   Chronic venous insufficiency    Diverticulitis    hospitalized 2013   Hypothyroidism    IBS (irritable bowel syndrome)    Lumbar degenerative disc disease    Mood disorder ([place]) 2011   adjustment issues mostly to moving and husband's illness   Osteopenia    PSVT (paroxysmal supraventricular tachycardia)    Raynaud's syndrome    Rosacea     Past Surgical History:  Procedure Laterality Date   CATARACT EXTRACTION W/PHACO Left 09/24/2021   Procedure: CATARACT EXTRACTION PHACO AND INTRAOCULAR LENS PLACEMENT ([place]) LEFT VIVITY TORIC 15.68 02:02.5;  Surgeon: Leandrew Koyanagi, MD;  Location: [facility];  Service: Ophthalmology;  Laterality: Left;   CATARACT EXTRACTION W/PHACO Right 10/06/2021   Procedure: CATARACT EXTRACTION PHACO AND INTRAOCULAR LENS PLACEMENT (  [place]) RIGHT VIVITY TORIC 24.29 02:02.7;  Surgeon: Leandrew Koyanagi, MD;  Location: [facility];  Service: Ophthalmology;  Laterality: Right;   MOHS SURGERY  2017   BSC?   PILONIDAL CYST EXCISION  1958   TONSILLECTOMY AND ADENOIDECTOMY      Family History  Problem Relation Age of Onset   Diabetes Mother    Alcohol abuse Father    Diabetes Sister    Thyroid cancer Daughter     Social History   Socioeconomic History   Marital status: Widowed    Spouse name: Not on file   Number of children: 2   Years of education: Not on file   Highest education level: Not on file  Occupational History    Occupation: Web designer    Comment: Manufacturing systems engineer  Tobacco Use   Smoking status: Never    Passive exposure: Never   Smokeless tobacco: Never  Vaping Use   Vaping Use: Never used  Substance and Sexual Activity   Alcohol use: Never   Drug use: Never   Sexual activity: Not on file  Other Topics Concern   Not on file  Social History Narrative   2 daughters      Has living will   Daughter Benjamine Mola Warehouse manager) then [employer] ([place]) have health care POA   Would accept resuscitation but no prolonged ventilation or tube feeds   Social Determinants of Health   Financial Resource Strain: Not on file  Food Insecurity: Not on file  Transportation Needs: Not on file  Physical Activity: Not on file  Stress: Not on file  Social Connections: Not on file  Intimate Partner Violence: Not on file   Review of Systems Ongoing vague fatigue-no answers Does go to fitness center, etc Finds herself sleeping in the afternoon No history of kidney stones Has not used any of the doxycycline in the past few months    Objective:   Physical Exam Constitutional:      Appearance: Normal appearance.  Abdominal:     Tenderness: There is no right CVA tenderness or left CVA tenderness.     Comments: ?slight suprapubic prominence but not dullness Some tenderness there  Neurological:     Mental Status: She is alert.              Assessment & Plan:

## 2022-01-05 ENCOUNTER — Other Ambulatory Visit: Payer: Self-pay

## 2022-01-05 DIAGNOSIS — K862 Cyst of pancreas: Secondary | ICD-10-CM

## 2022-01-07 ENCOUNTER — Ambulatory Visit (INDEPENDENT_AMBULATORY_CARE_PROVIDER_SITE_OTHER): Payer: Medicare Other | Admitting: Urology

## 2022-01-07 ENCOUNTER — Encounter: Payer: Self-pay | Admitting: Urology

## 2022-01-07 VITALS — BP 163/82 | HR 81 | Ht 65.5 in | Wt 111.0 lb

## 2022-01-07 DIAGNOSIS — R3129 Other microscopic hematuria: Secondary | ICD-10-CM

## 2022-01-07 DIAGNOSIS — R1030 Lower abdominal pain, unspecified: Secondary | ICD-10-CM | POA: Diagnosis not present

## 2022-01-07 LAB — URINALYSIS, COMPLETE
Bilirubin, UA: NEGATIVE
Glucose, UA: NEGATIVE
Ketones, UA: NEGATIVE
Leukocytes,UA: NEGATIVE
Nitrite, UA: NEGATIVE
Specific Gravity, UA: 1.02 (ref 1.005–1.030)
Urobilinogen, Ur: 0.2 mg/dL (ref 0.2–1.0)
pH, UA: 7 (ref 5.0–7.5)

## 2022-01-07 LAB — MICROSCOPIC EXAMINATION

## 2022-01-07 NOTE — Progress Notes (Unsigned)
01/07/2022 1:29 PM   Robyn Washington 06/01/1934 [id number]  Referring provider: Venia Carbon, MD [place],  [place] [address]  No chief complaint on file.   HPI: 87 year old female referred for further evaluation of urinary symptoms and microscopic hematuria.  She was seen by Dr. Silvio Pate in the office on 11/11/2021 which showed a urinary dip was performed which was negative other than for 2+ blood.  No microscopic exam was performed.  She also had a urinalysis including microscopic exam the previous month which showed 5 red blood cells per high-powered field.  Based on the above, she was started on empiric Bactrim.  No urine culture was sent.   PMH: Past Medical History:  Diagnosis Date   Aortic valve insufficiency    Basal cell carcinoma 2014   Right Forehead, MOHS   Chronic venous insufficiency    Diverticulitis    hospitalized 2013   Hypothyroidism    IBS (irritable bowel syndrome)    Lumbar degenerative disc disease    Mood disorder ([place]) 2011   adjustment issues mostly to moving and husband's illness   Osteopenia    PSVT (paroxysmal supraventricular tachycardia)    Raynaud's syndrome    Rosacea     Surgical History: Past Surgical History:  Procedure Laterality Date   CATARACT EXTRACTION W/PHACO Left 09/24/2021   Procedure: CATARACT EXTRACTION PHACO AND INTRAOCULAR LENS PLACEMENT ([place]) LEFT VIVITY TORIC 15.68 02:02.5;  Surgeon: Leandrew Koyanagi, MD;  Location: [facility];  Service: Ophthalmology;  Laterality: Left;   CATARACT EXTRACTION W/PHACO Right 10/06/2021   Procedure: CATARACT EXTRACTION PHACO AND INTRAOCULAR LENS PLACEMENT ([place]) RIGHT VIVITY TORIC 24.29 02:02.7;  Surgeon: Leandrew Koyanagi, MD;  Location: [facility];  Service: Ophthalmology;  Laterality: Right;   MOHS SURGERY  2017   BSC?   PILONIDAL CYST EXCISION  1958   TONSILLECTOMY AND ADENOIDECTOMY      Home Medications:  Allergies as of 01/07/2022        Reactions   Bioflavonoids Swelling   Swelling of lips   Banana    Barley Grass Other (See Comments)   Other reaction(s):   Pear Other (See Comments)   Other reaction(s): Feels bad   Pineapple    Rice Other (See Comments)   Rhinitis   Rye Grass Flower Pollen Extract  [gramineae Pollens] Other (See Comments)   Wheat Bran Other (See Comments)   Hydroxychloroquine Nausea Only, Rash   Lightheadedness        Medication List        Accurate as of January 07, 2022  1:29 PM. If you have any questions, ask your nurse or doctor.          Calcium 600+D Plus Minerals 600-400 MG-UNIT Tabs Take by mouth.   Centrum Silver tablet Take by mouth.   denosumab 60 MG/ML Sosy injection Commonly known as: PROLIA Inject 60 mg into the skin every 6 (six) months.   doxycycline 20 MG tablet Commonly known as: PERIOSTAT Take 1 tablet twice a day with food and drink   DULoxetine 30 MG capsule Commonly known as: CYMBALTA Take 1 capsule (30 mg total) by mouth daily.   famotidine 20 MG tablet Commonly known as: PEPCID Take 20 mg by mouth 2 (two) times daily as needed for heartburn or indigestion.   fexofenadine 180 MG tablet Commonly known as: ALLEGRA Take 180 mg by mouth as needed.   levothyroxine 75 MCG tablet Commonly known as: SYNTHROID TAKE 1 TABLET BY MOUTH  DAILY   omeprazole 20 MG capsule Commonly known as: PRILOSEC Take 1 capsule (20 mg total) by mouth daily as needed.   sulfamethoxazole-trimethoprim 400-80 MG tablet Commonly known as: BACTRIM Take 1 tablet by mouth 2 (two) times daily.   timolol 0.5 % ophthalmic solution Commonly known as: TIMOPTIC   triamcinolone cream 0.1 % Commonly known as: KENALOG Apply to itchy rash 1-2 times daily until improved. Avoid face, groin, underarms.        Allergies:  Allergies  Allergen Reactions   Bioflavonoids Swelling    Swelling of lips    Banana    Barley Grass Other (See Comments)    Other reaction(s):    Pear Other (See Comments)    Other reaction(s): Feels bad    Pineapple    Rice Other (See Comments)    Rhinitis    Rye Grass Flower Pollen Extract  [Gramineae Pollens] Other (See Comments)   Wheat Bran Other (See Comments)   Hydroxychloroquine Nausea Only and Rash    Lightheadedness    Family History: Family History  Problem Relation Age of Onset   Diabetes Mother    Alcohol abuse Father    Diabetes Sister    Thyroid cancer Daughter     Social History:  reports that she has never smoked. She has never been exposed to tobacco smoke. She has never used smokeless tobacco. She reports that she does not drink alcohol and does not use drugs.   Physical Exam: There were no vitals taken for this visit.  Constitutional:  Alert and oriented, No acute distress. HEENT: [place] AT, moist mucus membranes.  Trachea midline, no masses. Cardiovascular: No clubbing, cyanosis, or edema. Respiratory: Normal respiratory effort, no increased work of breathing. GI: Abdomen is soft, nontender, nondistended, no abdominal masses GU: No CVA tenderness Skin: No rashes, bruises or suspicious lesions. Neurologic: Grossly intact, no focal deficits, moving all 4 extremities. Psychiatric: Normal mood and affect.  Laboratory Data: Lab Results  Component Value Date   WBC 6.2 02/25/2021   HGB 13.0 02/25/2021   HCT 40.1 02/25/2021   MCV 100.8 (H) 02/25/2021   PLT 189.0 02/25/2021    Lab Results  Component Value Date   CREATININE 0.62 02/25/2021    No results found for: "PSA"  No results found for: "TESTOSTERONE"  No results found for: "HGBA1C"  Urinalysis    Component Value Date/Time   BILIRUBINUR negative 12/16/2021 0953   PROTEINUR Negative 12/16/2021 0953   UROBILINOGEN 0.2 12/16/2021 0953   NITRITE negative 12/16/2021 0953   LEUKOCYTESUR Negative 12/16/2021 0953    No results found for: "LABMICR", "WBCUA", "RBCUA", "LABEPIT", "MUCUS", "BACTERIA"  Pertinent Imaging: *** No results  found for this or any previous visit.  No results found for this or any previous visit.  No results found for this or any previous visit.  No results found for this or any previous visit.  No results found for this or any previous visit.  No valid procedures specified. No results found for this or any previous visit.  No results found for this or any previous visit.   Assessment & Plan:    1. Microscopic hematuria ***   No follow-ups on file.  Hollice Espy, MD  [facility] [address], [place], [place] [address] [phone]

## 2022-01-07 NOTE — Patient Instructions (Signed)

## 2022-01-11 ENCOUNTER — Ambulatory Visit
Admission: RE | Admit: 2022-01-11 | Discharge: 2022-01-11 | Disposition: A | Discharge status: Home / Self Care | Payer: Medicare Other | Source: Ambulatory Visit | Attending: Gastroenterology | Admitting: Gastroenterology

## 2022-01-11 ENCOUNTER — Other Ambulatory Visit: Payer: Self-pay | Admitting: Gastroenterology

## 2022-01-11 DIAGNOSIS — K862 Cyst of pancreas: Secondary | ICD-10-CM | POA: Diagnosis present

## 2022-01-11 MED ORDER — GADOBUTROL 1 MMOL/ML IV SOLN
5.0000 mL | Freq: Once | INTRAVENOUS | Status: AC | PRN
  Administered 2022-01-11: 7.5 mL via INTRAVENOUS

## 2022-01-13 ENCOUNTER — Ambulatory Visit
Admission: RE | Admit: 2022-01-13 | Discharge: 2022-01-13 | Disposition: A | Discharge status: Home / Self Care | Payer: Medicare Other | Source: Ambulatory Visit | Attending: Urology | Admitting: Urology

## 2022-01-13 DIAGNOSIS — R3129 Other microscopic hematuria: Secondary | ICD-10-CM | POA: Insufficient documentation

## 2022-01-27 ENCOUNTER — Ambulatory Visit (INDEPENDENT_AMBULATORY_CARE_PROVIDER_SITE_OTHER): Payer: Medicare Other | Admitting: Gastroenterology

## 2022-01-27 ENCOUNTER — Encounter: Payer: Self-pay | Admitting: Gastroenterology

## 2022-01-27 VITALS — BP 179/95 | HR 78 | Temp 97.5°F | Wt 112.0 lb

## 2022-01-27 DIAGNOSIS — K581 Irritable bowel syndrome with constipation: Secondary | ICD-10-CM | POA: Diagnosis not present

## 2022-01-27 NOTE — Progress Notes (Signed)
Primary Care Physician: Venia Carbon, MD  Primary Gastroenterologist:  Dr. Lucilla Lame  Chief Complaint  Patient presents with   Gastroesophageal Reflux   Abdominal Pain    epigastric   Gas    HPI: Robyn Washington is a 87 y.o. female here who has seen me in the past for esophageal dysmotility.  The patient also has a history of pancreatic cyst with a recent MRI that showed the pancreatic cyst to be the same size and likely represents a IPMN.  There was a recommendation that no further evaluation or following should be done to this lesion due to to the patient's advanced age.  The patient had reported reflux symptoms when she had last seen me but was better when she took acid suppression medication.  She was taking some omeprazole that she had at home at that time. The patient reports that she has a lot of bloating and has had multiple imaging done of her abdomen that she states her was shows either a moderate or large amounts of stool burden. The patient also reports that she has some clear mucus with water.  She also has ice cream and cheese regularly.  Past Medical History:  Diagnosis Date   Aortic valve insufficiency    Basal cell carcinoma 2014   Right Forehead, MOHS   Chronic venous insufficiency    Diverticulitis    hospitalized 2013   Hypothyroidism    IBS (irritable bowel syndrome)    Lumbar degenerative disc disease    Mood disorder ([place]) 2011   adjustment issues mostly to moving and husband's illness   Osteopenia    PSVT (paroxysmal supraventricular tachycardia)    Raynaud's syndrome    Rosacea     Current Outpatient Medications  Medication Sig Dispense Refill   Calcium Carbonate-Vit D-Min (CALCIUM 600+D PLUS MINERALS) 600-400 MG-UNIT TABS Take by mouth.     denosumab (PROLIA) 60 MG/ML SOSY injection Inject 60 mg into the skin every 6 (six) months.     doxycycline (PERIOSTAT) 20 MG tablet Take 1 tablet twice a day with food and drink 180 tablet 3    DULoxetine (CYMBALTA) 30 MG capsule Take 1 capsule (30 mg total) by mouth daily. 90 capsule 3   fexofenadine (ALLEGRA) 180 MG tablet Take 180 mg by mouth as needed.     levothyroxine (SYNTHROID) 75 MCG tablet TAKE 1 TABLET BY MOUTH  DAILY 90 tablet 3   Multiple Vitamins-Minerals (CENTRUM SILVER) tablet Take by mouth.     omeprazole (PRILOSEC) 20 MG capsule Take 1 capsule (20 mg total) by mouth daily as needed. 90 capsule 3   timolol (TIMOPTIC) 0.5 % ophthalmic solution      triamcinolone cream (KENALOG) 0.1 % Apply to itchy rash 1-2 times daily until improved. Avoid face, groin, underarms. 240 g 0   No current facility-administered medications for this visit.    Allergies as of 01/27/2022 - Review Complete 01/27/2022  Allergen Reaction Noted   Bioflavonoids Swelling 07/29/2012   Banana  01/22/2016   Barley grass Other (See Comments) 07/16/2015   Pear Other (See Comments) 07/16/2015   Pineapple  01/22/2016   Rice Other (See Comments) 07/29/2012   Rye grass flower pollen extract  [gramineae pollens] Other (See Comments) 07/16/2015   Wheat bran Other (See Comments) 07/16/2015   Hydroxychloroquine Nausea Only and Rash 02/04/2017    ROS:  General: Negative for anorexia, weight loss, fever, chills, fatigue, weakness. ENT: Negative for hoarseness, difficulty swallowing , nasal congestion. CV:  Negative for chest pain, angina, palpitations, dyspnea on exertion, peripheral edema.  Respiratory: Negative for dyspnea at rest, dyspnea on exertion, cough, sputum, wheezing.  GI: See history of present illness. GU:  Negative for dysuria, hematuria, urinary incontinence, urinary frequency, nocturnal urination.  Endo: Negative for unusual weight change.    Physical Examination:   BP (!) 174/79 (BP Location: Left Arm, Patient Position: Sitting, Cuff Size: Normal)   Pulse 75   Temp (!) 97.5 F (36.4 C) (Oral)   Wt 112 lb (50.8 kg)   BMI 18.35 kg/m   General: Well-nourished, well-developed in  no acute distress.  Eyes: No icterus. Conjunctivae pink. Neuro: Alert and oriented x 3.  Grossly intact. Skin: Warm and dry, no jaundice.   Psych: Alert and cooperative, normal mood and affect.  Labs:    Imaging Studies: US RENAL  Result Date: 01/14/2022 CLINICAL DATA:  Microscopic hematuria EXAM: RENAL / URINARY TRACT ULTRASOUND COMPLETE COMPARISON:  None Available. FINDINGS: Right Kidney: Renal measurements: 9.8 x 4.0 x 5.6 cm = volume: 1197 mL. Echogenicity within normal limits. No mass or hydronephrosis visualized. Left Kidney: Renal measurements: 10.6 x 5.6 x 3.9 cm = volume: 122 mL. Echogenicity within normal limits. No mass or hydronephrosis visualized. Bladder: Appears normal for degree of bladder distention. Other: None. IMPRESSION: Normal study. No cause for hematuria identified. Electronically Signed   By: Dorise Bullion III M.D.   On: 01/14/2022 12:58   MR ABDOMEN MRCP W WO CONTAST  Result Date: 01/11/2022 CLINICAL DATA:  Pancreatic cystic lesion EXAM: MRI ABDOMEN WITHOUT AND WITH CONTRAST (INCLUDING MRCP) TECHNIQUE: Multiplanar multisequence MR imaging of the abdomen was performed both before and after the administration of intravenous contrast. Heavily T2-weighted images of the biliary and pancreatic ducts were obtained, and three-dimensional MRCP images were rendered by post processing. CONTRAST:  7.63m GADAVIST GADOBUTROL 1 MMOL/ML IV SOLN COMPARISON:  04/05/2020 FINDINGS: Lower chest: No acute abnormality. Hepatobiliary: Unchanged definitively benign hemangioma of the posterior liver dome, hepatic segment VII measuring 1.0 x 0.7 cm (series 24, image 13). No gallstones or gallbladder wall thickening. Unchanged prominence of the common bile duct measuring up to 0.6 cm again with mild intrahepatic biliary ductal dilatation (series 4, image 19). Pancreas: Unchanged 0.8 cm fluid signal cystic lesion of the pancreatic uncinate (series 4, image 19). No evidence of solid component or  contrast enhancement. No pancreatic ductal dilatation or surrounding inflammatory changes. Spleen: Normal in size without significant abnormality. Adrenals/Urinary Tract: Adrenal glands are unremarkable. Kidneys are normal, without renal calculi, solid lesion, or hydronephrosis. Stomach/Bowel: Stomach is within normal limits. No evidence of bowel wall thickening, distention, or inflammatory changes. Large burden of stool throughout the colon. Vascular/Lymphatic: No significant vascular findings are present. No enlarged abdominal lymph nodes. Other: No abdominal wall hernia or abnormality. No ascites. Musculoskeletal: No acute or significant osseous findings. IMPRESSION: Unchanged 0.8 cm fluid signal cystic lesion of the pancreatic uncinate. No evidence of solid component or contrast enhancement. No pancreatic ductal dilatation or surrounding inflammatory changes. This is most likely a small side branch IPMN or pseudocyst. As there is no observed increased risk of malignancy for such lesions smaller than 2 cm, particularly given advanced patient age, no further follow-up or characterization is required. Electronically Signed   By: ADelanna AhmadiM.D.   On: 01/11/2022 15:02   MR 3D Recon At Scanner  Result Date: 01/11/2022 CLINICAL DATA:  Pancreatic cystic lesion EXAM: MRI ABDOMEN WITHOUT AND WITH CONTRAST (INCLUDING MRCP) TECHNIQUE: Multiplanar multisequence MR imaging of  the abdomen was performed both before and after the administration of intravenous contrast. Heavily T2-weighted images of the biliary and pancreatic ducts were obtained, and three-dimensional MRCP images were rendered by post processing. CONTRAST:  7.53m GADAVIST GADOBUTROL 1 MMOL/ML IV SOLN COMPARISON:  04/05/2020 FINDINGS: Lower chest: No acute abnormality. Hepatobiliary: Unchanged definitively benign hemangioma of the posterior liver dome, hepatic segment VII measuring 1.0 x 0.7 cm (series 24, image 13). No gallstones or gallbladder wall  thickening. Unchanged prominence of the common bile duct measuring up to 0.6 cm again with mild intrahepatic biliary ductal dilatation (series 4, image 19). Pancreas: Unchanged 0.8 cm fluid signal cystic lesion of the pancreatic uncinate (series 4, image 19). No evidence of solid component or contrast enhancement. No pancreatic ductal dilatation or surrounding inflammatory changes. Spleen: Normal in size without significant abnormality. Adrenals/Urinary Tract: Adrenal glands are unremarkable. Kidneys are normal, without renal calculi, solid lesion, or hydronephrosis. Stomach/Bowel: Stomach is within normal limits. No evidence of bowel wall thickening, distention, or inflammatory changes. Large burden of stool throughout the colon. Vascular/Lymphatic: No significant vascular findings are present. No enlarged abdominal lymph nodes. Other: No abdominal wall hernia or abnormality. No ascites. Musculoskeletal: No acute or significant osseous findings. IMPRESSION: Unchanged 0.8 cm fluid signal cystic lesion of the pancreatic uncinate. No evidence of solid component or contrast enhancement. No pancreatic ductal dilatation or surrounding inflammatory changes. This is most likely a small side branch IPMN or pseudocyst. As there is no observed increased risk of malignancy for such lesions smaller than 2 cm, particularly given advanced patient age, no further follow-up or characterization is required. Electronically Signed   By: ADelanna AhmadiM.D.   On: 01/11/2022 15:02    Assessment and Plan:   MMerna Baldiis a 87y.o. y/o female who comes in today with a report of abdominal bloating and distention.  The patient states that she has been treated the past with magnesium citrate and GoLYTELY but has had some side effects with MiraLAX. The patient reports that she would like to get back to a more active lifestyle.  She believes that this can be done if she was able to move her bowels more frequently.  The patient has  been given the option of Amitiza versus Linzess but states that she would like to try to to take MiraLAX again and see if she is helped by that.  The patient has been told that if her symptoms do not improve that she can call me and we can start her on something to help her retained stool. The patient has also been told to take Lactaid pills when she eats cheese and ice cream since she likes to eat cheese and ice cream.  The patient has been explained the plan and agrees with it.   The patient's blood pressure is high and the patient should follow-up with her primary care provider for continued follow-up of her blood pressure.   DLucilla Lame MD. FMarval Regal   Note: This dictation was prepared with Dragon dictation along with smaller phrase technology. Any transcriptional errors that result from this process are unintentional.

## 2022-01-27 NOTE — Patient Instructions (Signed)
Patient advised to monitor BP at home and reach out to PCP for evaulation

## 2022-01-28 ENCOUNTER — Ambulatory Visit (INDEPENDENT_AMBULATORY_CARE_PROVIDER_SITE_OTHER): Payer: Medicare Other | Admitting: Urology

## 2022-01-28 VITALS — BP 151/77 | HR 84

## 2022-01-28 DIAGNOSIS — R3129 Other microscopic hematuria: Secondary | ICD-10-CM | POA: Diagnosis not present

## 2022-01-28 NOTE — Progress Notes (Signed)
   01/28/22  CC: No chief complaint on file.   HPI: 87 year old female with incidental microscopic hematuria who presents today for office cystoscopy.  She underwent a renal ultrasound on 01/14/2022 which was unremarkable, normal bilateral kidneys without pathology.  She also had a fairly recent MRI of the abdomen on 01/11/2022 which was unremarkable.  Blood pressure (!) 151/77, pulse 84. NED. A&Ox3.   No respiratory distress   Abd soft, NT, ND Normal external genitalia with patent urethral meatus.  Mild periurethral atrophy was appreciated.  Cystoscopy Procedure Note  Patient identification was confirmed, informed consent was obtained, and patient was prepped using Betadine solution.  Lidocaine jelly was administered per urethral meatus.    Procedure: - Flexible cystoscope introduced, without any difficulty.   - Thorough search of the bladder revealed:    normal urethral meatus    normal urothelium posterior extrinsic compression from uterus    no stones    no ulcers     no tumors    no urethral polyps    no trabeculation  - Ureteral orifices were normal in position and appearance.  Post-Procedure: - Patient tolerated the procedure well  Assessment/ Plan:  1. 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  We have elected to defer CT urogram at this time especially in light of today's urinalysis  I have recommended that she follow-up annually with her PCP with urinalysis, if the degree of her microscopic hematuria progresses, refer back ASAP or in 2 to 3 years for reevaluation if low-volume.  She understands  and is agreeable this plan. - Urinalysis, Complete   Hollice Espy, MD

## 2022-01-29 LAB — URINALYSIS, COMPLETE
Bilirubin, UA: NEGATIVE
Glucose, UA: NEGATIVE
Ketones, UA: NEGATIVE
Leukocytes,UA: NEGATIVE
Nitrite, UA: NEGATIVE
Protein,UA: NEGATIVE
Specific Gravity, UA: <1.005 — ABNORMAL LOW (ref 1.005–1.030)
Urobilinogen, Ur: 0.2 mg/dL (ref 0.2–1.0)
pH, UA: 6 (ref 5.0–7.5)

## 2022-01-29 LAB — MICROSCOPIC EXAMINATION

## 2022-02-02 NOTE — Progress Notes (Unsigned)
Cardiology Office Note  Date:  02/03/2022   ID:  Robyn Washington, DOB 1934-03-31, MRN [id number]  PCP:  Venia Carbon, MD   Chief Complaint  Patient presents with   3 month follow up     Patient c/o shortness of breath with walking a short distance, tachycardia and elevated blood pressure mostly in the mornings. Medications reviewed by the patient verbally.     HPI:  Robyn Washington is a 87 y.o. female with a history of  SVT,  mild aortic insufficiency, chronic venous insufficiency  Osteoporosis,back issues Nutcracker esoph Chronic SOB who presents for follow-up of her near syncope, SOB  Last seen in clinic March 2023 Lives at [facility]  Prior testing reviewed Echocardiogram October 2023 normal ejection fraction, normal RV size and function, no significant valvular heart disease,  Zio monitor Rare episodes sinus tachycardia longest 15 seconds  Seen by Dr. Allen Norris, 01/27/22:  BP elevated 038 systolic Seen by urology, BP elevated 882 systolic  Checking at home, somewhat labile, 1 time was low after exercise  Still exercising, aerobic: 20-30 min  Does not seem to be bothered by exercise, no shortness of breath or chest tightness Avid exerciser,  rows/treadmill/bike, daily  She does report some shortness of breath on walking down to the mailbox  episode of near syncope last august 2021  Recently cut back on her Cymbalta, felt this was causing symptoms  EKG personally reviewed by myself on todays visit Normal sinus rhythm rate 73 bpm no significant ST-T wave changes  past medical history reviewed got out of bed when she felt very lightheaded and weak.  She had to sit back down.   nauseated and noticed her heart racing. She put her head between her knees. She was drenched in perspiration but did not lose consciousness.  She walked to the bathroom and felt like she was pale when looking at herself in the mirror. She cannot identify any triggers. She slept okay the  night before.  brief palpitations  prior cardiology evaluation at [facility] Did have 8 episodes of SVT--and occasional palpitations Did try vagal maneuver for this (with bad spell with diaphoresis) No chest pain or SOB  Echo 1. The left ventricle is normal in size with normal wall thickness.  2. The left ventricular systolic function is normal, LVEF is visually estimated at > 55%.  3. The aortic valve is trileaflet with mildly thickened leaflets with normal excursion.  4. There is mild to moderate aortic regurgitation.  5. The right ventricle is normal in size, with normal systolic function.  6. There is mild to moderate tricuspid regurgitation.  7. IVC size and inspiratory change suggest mildly elevated right atrial pressure. (5-10 mmHg).   PMH:   has a past medical history of Aortic valve insufficiency, Basal cell carcinoma (2014), Chronic venous insufficiency, Diverticulitis, Hypothyroidism, IBS (irritable bowel syndrome), Lumbar degenerative disc disease, Mood disorder ([place]) (2011), Osteopenia, PSVT (paroxysmal supraventricular tachycardia), Raynaud's syndrome, and Rosacea.  PSH:    Past Surgical History:  Procedure Laterality Date   CATARACT EXTRACTION W/PHACO Left 09/24/2021   Procedure: CATARACT EXTRACTION PHACO AND INTRAOCULAR LENS PLACEMENT ([place]) LEFT VIVITY TORIC 15.68 02:02.5;  Surgeon: Leandrew Koyanagi, MD;  Location: [facility];  Service: Ophthalmology;  Laterality: Left;   CATARACT EXTRACTION W/PHACO Right 10/06/2021   Procedure: CATARACT EXTRACTION PHACO AND INTRAOCULAR LENS PLACEMENT ([place]) RIGHT VIVITY TORIC 24.29 02:02.7;  Surgeon: Leandrew Koyanagi, MD;  Location: [facility];  Service: Ophthalmology;  Laterality: Right;  MOHS SURGERY  2017   BSC?   PILONIDAL CYST EXCISION  1958   TONSILLECTOMY AND ADENOIDECTOMY      Current Outpatient Medications  Medication Sig Dispense Refill   Calcium Carbonate-Vit D-Min (CALCIUM 600+D PLUS MINERALS) 600-400  MG-UNIT TABS Take by mouth.     denosumab (PROLIA) 60 MG/ML SOSY injection Inject 60 mg into the skin every 6 (six) months.     doxycycline (PERIOSTAT) 20 MG tablet Take 1 tablet twice a day with food and drink 180 tablet 3   DULoxetine (CYMBALTA) 30 MG capsule Take 1 capsule (30 mg total) by mouth daily. 90 capsule 3   fexofenadine (ALLEGRA) 180 MG tablet Take 180 mg by mouth as needed.     levothyroxine (SYNTHROID) 75 MCG tablet TAKE 1 TABLET BY MOUTH  DAILY 90 tablet 3   Multiple Vitamins-Minerals (CENTRUM SILVER) tablet Take by mouth.     omeprazole (PRILOSEC) 20 MG capsule Take 1 capsule (20 mg total) by mouth daily as needed. 90 capsule 3   timolol (TIMOPTIC) 0.5 % ophthalmic solution      triamcinolone cream (KENALOG) 0.1 % Apply to itchy rash 1-2 times daily until improved. Avoid face, groin, underarms. 240 g 0   No current facility-administered medications for this visit.    Allergies:   Bioflavonoids, Banana, Barley grass, Pear, Pineapple, Rice, Rye grass flower pollen extract  [gramineae pollens], Wheat bran, and Hydroxychloroquine   Social History:  The patient  reports that she has never smoked. She has never been exposed to tobacco smoke. She has never used smokeless tobacco. She reports that she does not drink alcohol and does not use drugs.   Family History:   family history includes Alcohol abuse in her father; Diabetes in her mother and sister; Thyroid cancer in her daughter.   Review of Systems: Review of Systems  Constitutional: Negative.   HENT: Negative.    Respiratory: Negative.    Cardiovascular: Negative.   Gastrointestinal: Negative.   Musculoskeletal: Negative.   Neurological: Negative.   Psychiatric/Behavioral: Negative.    All other systems reviewed and are negative.  PHYSICAL EXAM: VS:  BP (!) 140/70 (BP Location: Left Arm, Patient Position: Sitting, Cuff Size: Normal)   Pulse 73   Ht '5\' 5"'$  (1.651 m)   Wt 110 lb 8 oz (50.1 kg)   SpO2 97%   BMI 18.39  kg/m  , BMI Body mass index is 18.39 kg/m. Constitutional:  oriented to person, place, and time. No distress.  HENT:  Head: Grossly normal Eyes:  no discharge. No scleral icterus.  Neck: No JVD, no carotid bruits  Cardiovascular: Regular rate and rhythm, no murmurs appreciated Pulmonary/Chest: Clear to auscultation bilaterally, no wheezes or rails Abdominal: Soft.  no distension.  no tenderness.  Musculoskeletal: Normal range of motion Neurological:  normal muscle tone. Coordination normal. No atrophy Skin: Skin warm and dry Psychiatric: normal affect, pleasant  Recent Labs: 02/25/2021: ALT 29; BUN 19; Creatinine, Ser 0.62; Hemoglobin 13.0; Platelets 189.0; Potassium 4.0; Sodium 140; TSH 0.96    Lipid Panel Lab Results  Component Value Date   CHOL 187 11/12/2020   HDL 78 11/12/2020   LDLCALC 94 11/12/2020   TRIG 86 11/12/2020      Wt Readings from Last 3 Encounters:  02/03/22 110 lb 8 oz (50.1 kg)  01/27/22 112 lb (50.8 kg)  01/07/22 111 lb (50.3 kg)     ASSESSMENT AND PLAN:  Problem List Items Addressed This Visit  Cardiology Problems   SVT (supraventricular tachycardia) - Primary   Relevant Orders   EKG 12-Lead   Raynaud's syndrome   Relevant Orders   EKG 12-Lead     Other   History of syncope   Esophageal dysmotility   Other Visit Diagnoses     Aortic valve insufficiency, etiology of cardiac valve disease unspecified       Relevant Orders   EKG 12-Lead   Near syncope       Relevant Orders   EKG 12-Lead     1. Near syncope/paroxysmal tachycardia Zio monitor results reviewed, rare short episodes of tachycardia In general has been asymptomatic  2. SVT (supraventricular tachycardia) Prior Zio patch monitor 4/11 showing 2.1% PVC burden as well as 8 episodes of narrow complex tachycardia. Would continue to monitor symptoms at this time, not on beta-blocker  3. Aortic valve insufficiency Mild to moderate on echo October 2023, relatively  unchanged No change in murmur on exam  4. HTN Somewhat labile blood pressure numbers especially on office visits Long discussion, we have recommended some home monitoring and discussion of numbers first before starting medication given the labile nature of her numbers  5.  Fatigue Active, exercising with good exercise tolerance    Total encounter time more than 30 minutes  Greater than 50% was spent in counseling and coordination of care with the patient    Signed, Esmond Plants, M.D., Ph.D. [place], [place]

## 2022-02-03 ENCOUNTER — Encounter: Payer: Self-pay | Admitting: Cardiovascular Disease

## 2022-02-03 ENCOUNTER — Ambulatory Visit: Payer: Medicare Other | Attending: Cardiovascular Disease | Admitting: Cardiovascular Disease

## 2022-02-03 VITALS — BP 140/70 | HR 73 | Ht 65.0 in | Wt 110.5 lb

## 2022-02-03 DIAGNOSIS — Z87898 Personal history of other specified conditions: Secondary | ICD-10-CM | POA: Insufficient documentation

## 2022-02-03 DIAGNOSIS — I351 Nonrheumatic aortic (valve) insufficiency: Secondary | ICD-10-CM

## 2022-02-03 DIAGNOSIS — I7300 Raynaud's syndrome without gangrene: Secondary | ICD-10-CM | POA: Diagnosis not present

## 2022-02-03 DIAGNOSIS — K224 Dyskinesia of esophagus: Secondary | ICD-10-CM | POA: Insufficient documentation

## 2022-02-03 DIAGNOSIS — I471 Supraventricular tachycardia, unspecified: Secondary | ICD-10-CM | POA: Diagnosis not present

## 2022-02-03 DIAGNOSIS — R55 Syncope and collapse: Secondary | ICD-10-CM | POA: Diagnosis not present

## 2022-02-03 NOTE — Patient Instructions (Signed)
Please monitor blood pressure, call with numbers  Medication Instructions:  No changes  If you need a refill on your cardiac medications before your next appointment, please call your pharmacy.   Lab work: No new labs needed  Testing/Procedures: No new testing needed  Follow-Up: At [facility], you and your health needs are our priority.  As part of our continuing mission to provide you with exceptional heart care, we have created designated Provider Care Teams.  These Care Teams include your primary Cardiologist (physician) and Advanced Practice Providers (APPs -  Physician Assistants and Nurse Practitioners) who all work together to provide you with the care you need, when you need it.  You will need a follow up appointment in 12 months  Providers on your designated Care Team:   Murray Hodgkins, NP Christell Faith, PA-C Cadence Kathlen Mody, [place]  COVID-19 Vaccine Information can be found at: [email or web address].uk For questions related to vaccine distribution or appointments, please email [email or web address] or call [phone].

## 2022-02-04 ENCOUNTER — Telehealth: Payer: Self-pay | Admitting: Cardiovascular Disease

## 2022-02-04 NOTE — Telephone Encounter (Signed)
Per chart review, pt scheduled for nurse visit on 12/18 at 9 am

## 2022-02-04 NOTE — Telephone Encounter (Signed)
Pt calling as requested to see when she is able to come into office to have BP monitor calibrated. Please advise

## 2022-02-04 NOTE — Telephone Encounter (Signed)
Due to low patient census next week- we do have triage availability.  If the patient can come: 12/18- 8:30 am- 11:30 am or 1:30 pm- 3:30 pm 12/19- 8:30 am- 11:30 am or 1:30 pm- 3:30 pm 12/20- 8:30 am- 11:30 am or 1:30 pm- 3:30 pm 12/21- 1:30 pm- 2:30 pm   Scheduling- do you mind reaching out to this patient to see what day and time she can come and put her on the nurse schedule please and thank you!

## 2022-02-09 ENCOUNTER — Ambulatory Visit: Payer: Medicare Other | Attending: Cardiovascular Disease | Admitting: *Deleted

## 2022-02-09 DIAGNOSIS — I10 Essential (primary) hypertension: Secondary | ICD-10-CM | POA: Insufficient documentation

## 2022-02-09 NOTE — Progress Notes (Signed)
   Nurse Visit   Date of Encounter: 02/09/2022 ID: Robyn Washington, DOB December 22, 1934, MRN [id number]  PCP:  Venia Carbon, MD   [place] Providers Cardiologist:  Ida Rogue, MD      Visit Details   VS:  Blood pressure check. Home BP machine 126/78 Manual BP check 120/80 Heart rates were 75 & 78   Wt Readings from Last 3 Encounters:  02/03/22 110 lb 8 oz (50.1 kg)  01/27/22 112 lb (50.8 kg)  01/07/22 111 lb (50.3 kg)     Reason for visit: Blood pressure calibration  Performed today: Vitals, EKG, Provider consulted:Dr. Rockey Situ, and Education Changes (medications, testing, etc.) : No changes at this time.  Length of Visit: 20 minutes   Education reviewed and provided to patient.      Signed, Valora Corporal, RN  02/09/2022 9:19 AM

## 2022-02-09 NOTE — Patient Instructions (Signed)
Medication Instructions:  No changes at this time.   *If you need a refill on your cardiac medications before your next appointment, please call your pharmacy*   Lab Work: None  If you have labs (blood work) drawn today and your tests are completely normal, you will receive your results only by: [place] (if you have MyChart) OR A paper copy in the mail If you have any lab test that is abnormal or we need to change your treatment, we will call you to review the results.   Testing/Procedures: None   Education: Please monitor blood pressures and keep a log of your readings.   Make sure to check 2 hours after your medications.   AVOID these things for 30 minutes before checking your blood pressure: No Drinking caffeine. No Drinking alcohol. No Eating. No Smoking. No Exercising.  Five minutes before checking your blood pressure: Pee. Sit in a dining chair. Avoid sitting in a soft couch or armchair. Be quiet. Do not talk.   Important Information About Sugar

## 2022-02-25 ENCOUNTER — Other Ambulatory Visit: Payer: Medicare Other | Admitting: Urology

## 2022-02-27 ENCOUNTER — Encounter: Payer: Medicare Other | Admitting: Internal Medicine

## 2022-03-11 ENCOUNTER — Other Ambulatory Visit: Payer: Self-pay | Admitting: Gastroenterology

## 2022-03-16 ENCOUNTER — Telehealth: Payer: Self-pay

## 2022-03-16 NOTE — Telephone Encounter (Signed)
Patient called she can not use [facility] rosacea triple cream because its causing a red rash on her face, patient called and spoke with a [facility]ist at [facility] and he told patient she is probably allergic to Ivermectin, patient do not want to keep buying products she can't use but she would like something for her occasional rosacea flares, patient would like to know if there is maybe something over the counter she can use

## 2022-03-17 ENCOUNTER — Telehealth: Payer: Self-pay

## 2022-03-17 MED ORDER — AZELAIC ACID 15 % EX GEL: 1.0000 | Freq: Two times a day (BID) | CUTANEOUS | 0 refills | Status: DC

## 2022-03-17 NOTE — Telephone Encounter (Signed)
Called patient discussed We can give her some samples of zilxi foam, that is generally very well tolerated, she can use 1-2x/day as needed.  If she likes it, we can send it in, but because there isn't a generic, it may be difficult to get.  She could also try Azelaic acid gel (generic finacea) 1-2x/day as needed.  That is one of the medications in the triple cream, but generally well tolerated.   Patient will think about these options and she will give [place] a call if she would like to begin treatment

## 2022-03-17 NOTE — Telephone Encounter (Signed)
Patient returned call and wants the 30 day supply of finacea gel to [facility]. RX sent in. aw

## 2022-03-17 NOTE — Addendum Note (Signed)
Addended by: Johnsie Kindred R on: 03/17/2022 02:24 PM   Modules accepted: Orders

## 2022-03-18 NOTE — Telephone Encounter (Signed)
Patient called regarding finacea gel RX not being covered. She already picked up medication today as a "cash" pay option but she was told Medicare denied RX due to some sort of "1.66 quantity". Advised patient when she needs RF to call me and let me know and we will call the pharmacy before medication is filled next month. aw

## 2022-05-12 ENCOUNTER — Other Ambulatory Visit: Payer: Self-pay | Admitting: Internal Medicine

## 2022-05-12 NOTE — Telephone Encounter (Signed)
Refill sent for 90 days. Pt needs to be scheduled for AWV. Thank you.

## 2022-05-12 NOTE — Telephone Encounter (Signed)
Lvm for patient tcb and schedule 

## 2022-06-12 ENCOUNTER — Other Ambulatory Visit: Payer: Self-pay | Admitting: Internal Medicine

## 2022-06-29 ENCOUNTER — Other Ambulatory Visit: Payer: Self-pay | Admitting: Dermatology

## 2022-07-14 ENCOUNTER — Ambulatory Visit (INDEPENDENT_AMBULATORY_CARE_PROVIDER_SITE_OTHER): Payer: Medicare Other | Admitting: Dermatology

## 2022-07-14 VITALS — BP 119/76 | HR 63

## 2022-07-14 DIAGNOSIS — W908XXA Exposure to other nonionizing radiation, initial encounter: Secondary | ICD-10-CM

## 2022-07-14 DIAGNOSIS — X32XXXA Exposure to sunlight, initial encounter: Secondary | ICD-10-CM

## 2022-07-14 DIAGNOSIS — Z1283 Encounter for screening for malignant neoplasm of skin: Secondary | ICD-10-CM | POA: Diagnosis not present

## 2022-07-14 DIAGNOSIS — L578 Other skin changes due to chronic exposure to nonionizing radiation: Secondary | ICD-10-CM

## 2022-07-14 DIAGNOSIS — L719 Rosacea, unspecified: Secondary | ICD-10-CM

## 2022-07-14 DIAGNOSIS — L820 Inflamed seborrheic keratosis: Secondary | ICD-10-CM | POA: Diagnosis not present

## 2022-07-14 DIAGNOSIS — D1801 Hemangioma of skin and subcutaneous tissue: Secondary | ICD-10-CM

## 2022-07-14 DIAGNOSIS — L814 Other melanin hyperpigmentation: Secondary | ICD-10-CM | POA: Diagnosis not present

## 2022-07-14 DIAGNOSIS — L821 Other seborrheic keratosis: Secondary | ICD-10-CM

## 2022-07-14 DIAGNOSIS — Z85828 Personal history of other malignant neoplasm of skin: Secondary | ICD-10-CM

## 2022-07-14 DIAGNOSIS — D692 Other nonthrombocytopenic purpura: Secondary | ICD-10-CM

## 2022-07-14 DIAGNOSIS — L988 Other specified disorders of the skin and subcutaneous tissue: Secondary | ICD-10-CM

## 2022-07-14 MED ORDER — AZELAIC ACID 15 % EX GEL: CUTANEOUS | 11 refills | Status: DC

## 2022-07-14 NOTE — Progress Notes (Signed)
Follow-Up Visit   Subjective  Robyn Washington is a 88 y.o. female who presents for the following: Skin Cancer Screening and Full Body Skin Exam, hx of BCC on her forehead, patient c/o rosacea on her face treating with Azelaic acid with a fair response, patient got a red rash on her face from [facility] rosacea triple cream so she stopped triple cream. She has a spot in her scalp that she picks at and gets irritated.   The patient presents for Total-Body Skin Exam (TBSE) for skin cancer screening and mole check. The patient has spots, moles and lesions to be evaluated, some may be new or changing and the patient has concerns that these could be cancer.    The following portions of the chart were reviewed this encounter and updated as appropriate: medications, allergies, medical history  Review of Systems:  No other skin or systemic complaints except as noted in HPI or Assessment and Plan.  Objective  Well appearing patient in no apparent distress; mood and affect are within normal limits.  A full examination was performed including scalp, head, eyes, ears, nose, lips, neck, chest, axillae, abdomen, back, buttocks, bilateral upper extremities, bilateral lower extremities, hands, feet, fingers, toes, fingernails, and toenails. All findings within normal limits unless otherwise noted below.   Relevant physical exam findings are noted in the Assessment and Plan.  right posterior parietal scalp x 1 Stuck-on, waxy, tan-brown papule --Discussed benign etiology and prognosis.     Assessment & Plan   LENTIGINES, SEBORRHEIC KERATOSES, HEMANGIOMAS - Benign normal skin lesions - Benign-appearing - Call for any changes  MELANOCYTIC NEVI - Tan-brown and/or pink-flesh-colored symmetric macules and papules - Benign appearing on exam today - Observation - Call clinic for new or changing moles - Recommend daily use of broad spectrum spf 30+ sunscreen to sun-exposed areas.   ACTINIC  DAMAGE - Chronic condition, secondary to cumulative UV/sun exposure - diffuse scaly erythematous macules with underlying dyspigmentation - Recommend daily broad spectrum sunscreen SPF 30+ to sun-exposed areas, reapply every 2 hours as needed.  - Staying in the shade or wearing long sleeves, sun glasses (UVA+UVB protection) and wide brim hats (4-inch brim around the entire circumference of the hat) are also recommended for sun protection.  - Call for new or changing lesions.  LENTIGINES VS SEBORRHEIC KERATOSIS  Exam: scattered tan macules face Due to sun exposure Treatment Plan: Can use Azelaic acid daily to help fade Benign-appearing, observe. Recommend daily broad spectrum sunscreen SPF 30+ to sun-exposed areas, reapply every 2 hours as needed.  Call for any changes    Rosacea Face Exam: erythema and telangiectasias on medial cheeks and nose   Chronic and persistent condition with duration or expected duration over one year. Condition is symptomatic/ bothersome to patient. Not currently at goal.    Rosacea is a chronic progressive skin condition usually affecting the face of adults, causing redness and/or acne bumps. It is treatable but not curable. It sometimes affects the eyes (ocular rosacea) as well. It may respond to topical and/or systemic medication and can flare with stress, sun exposure, alcohol, exercise and some foods.  Daily application of broad spectrum spf 30+ sunscreen to face is recommended to reduce flares.    Treatment Plan:  Continue SM Azelaic acid 15% gel  Counseling for BBL / IPL / Laser and Coordination of Care Discussed the treatment option of Broad Band Light (BBL) /Intense Pulsed Light (IPL)/ Laser for skin discoloration, including brown spots and redness.  Typically we recommend at least 1-3 treatment sessions about 5-8 weeks apart for best results.  Cannot have tanned skin when BBL performed, and regular use of sunscreen is advised after the procedure to  help maintain results. The patient's condition may also require "maintenance treatments" in the future.  The fee for BBL / laser treatments is $350 per treatment session for the whole face.  A fee can be quoted for other parts of the body.  Insurance typically does not pay for BBL/laser treatments and therefore the fee is an out-of-pocket cost.   Discussed Rhofade, pt defers Rx  For temporary redness reduction Mix 1 bottle of Afrin original in 1 bottle of Cerave PM, shake well, and apply 1 hour before you want redness to be improved in the morning   Samples of Avene tolerance control given Samples of Neutrogena Hyaluronic acid given    DILATED PORE  Upper nasal dorsum  Benign, Observe Can use Azelaic acid daily or cream with retinol  Purpura - Chronic; persistent and recurrent.  Treatable, but not curable. Arms  - Violaceous macules and patches - Benign - Related to trauma, age, sun damage and/or use of blood thinners, chronic use of topical and/or oral steroids - Observe - Can use OTC arnica containing moisturizer such as Dermend Bruise Formula if desired - Call for worsening or other concerns   Inflamed seborrheic keratosis right posterior parietal scalp x 1  Symptomatic, irritating, patient would like treated.   Destruction of lesion - right posterior parietal scalp x 1 Complexity: simple   Destruction method: cryotherapy   Informed consent: discussed and consent obtained   Timeout:  patient name, date of birth, surgical site, and procedure verified Lesion destroyed using liquid nitrogen: Yes   Region frozen until ice ball extended beyond lesion: Yes   Outcome: patient tolerated procedure well with no complications   Post-procedure details: wound care instructions given     History of Basal Cell Carcinoma of the Skin Right forehead 2014 - No evidence of recurrence today - Recommend regular full body skin exams - Recommend daily broad spectrum sunscreen SPF 30+ to  sun-exposed areas, reapply every 2 hours as needed.  - Call if any new or changing lesions are noted between office visits     SKIN CANCER SCREENING PERFORMED TODAY.   Return in about 1 year (around 07/14/2023) for TBSE, hx of BCC .  I, Angelique Holm, CMA, am acting as scribe for Willeen Niece, MD .   Documentation: I have reviewed the above documentation for accuracy and completeness, and I agree with the above.  Willeen Niece, MD

## 2022-07-14 NOTE — Patient Instructions (Addendum)
Counseling for BBL / IPL / Laser and Coordination of Care Discussed the treatment option of Broad Band Light (BBL) /Intense Pulsed Light (IPL)/ Laser for skin discoloration, including brown spots and redness.  Typically we recommend at least 1-3 treatment sessions about 5-8 weeks apart for best results.  Cannot have tanned skin when BBL performed, and regular use of sunscreen is advised after the procedure to help maintain results. The patient's condition may also require "maintenance treatments" in the future.  The fee for BBL / laser treatments is $350 per treatment session for the whole face.  A fee can be quoted for other parts of the body.  Insurance typically does not pay for BBL/laser treatments and therefore the fee is an out-of-pocket cost.       For temporary redness reduction Mix 1 bottle of Afrin original in 1 bottle of Cerave PM, shake well, and apply 1 hour before you want redness to be improved in the morning    Cryotherapy Aftercare  Wash gently with soap and water everyday.   Apply Vaseline and Band-Aid daily until healed.     Recommend daily broad spectrum sunscreen SPF 30+ to sun-exposed areas, reapply every 2 hours as needed. Call for new or changing lesions.  Staying in the shade or wearing long sleeves, sun glasses (UVA+UVB protection) and wide brim hats (4-inch brim around the entire circumference of the hat) are also recommended for sun protection.       Due to recent changes in healthcare laws, you may see results of your pathology and/or laboratory studies on MyChart before the doctors have had a chance to review them. We understand that in some cases there may be results that are confusing or concerning to you. Please understand that not all results are received at the same time and often the doctors may need to interpret multiple results in order to provide you with the best plan of care or course of treatment. Therefore, we ask that you please give [place] 2 business  days to thoroughly review all your results before contacting the office for clarification. Should we see a critical lab result, you will be contacted sooner.   If You Need Anything After Your Visit  If you have any questions or concerns for your doctor, please call our main line at [phone] and press option 4 to reach your doctor's medical assistant. If no one answers, please leave a voicemail as directed and we will return your call as soon as possible. Messages left after 4 pm will be answered the following business day.   You may also send [place] a message via MyChart. We typically respond to MyChart messages within 1-2 business days.  For prescription refills, please ask your pharmacy to contact our office. Our fax number is [phone].  If you have an urgent issue when the clinic is closed that cannot wait until the next business day, you can page your doctor at the number below.    Please note that while we do our best to be available for urgent issues outside of office hours, we are not available 24/7.   If you have an urgent issue and are unable to reach [place], you may choose to seek medical care at your doctor's office, retail clinic, urgent care center, or emergency room.  If you have a medical emergency, please immediately call 911 or go to the emergency department.  Pager Numbers  - Dr. Gwen Pounds: [phone]  - Dr. Neale Burly: [phone]  - Dr. Roseanne Reno:  [phone]  In the event of inclement weather, please call our main line at [phone] for an update on the status of any delays or closures.  Dermatology Medication Tips: Please keep the boxes that topical medications come in in order to help keep track of the instructions about where and how to use these. Pharmacies typically print the medication instructions only on the boxes and not directly on the medication tubes.   If your medication is too expensive, please contact our office at [phone] option 4 or send [place] a  message through MyChart.   We are unable to tell what your co-pay for medications will be in advance as this is different depending on your insurance coverage. However, we may be able to find a substitute medication at lower cost or fill out paperwork to get insurance to cover a needed medication.   If a prior authorization is required to get your medication covered by your insurance company, please allow [place] 1-2 business days to complete this process.  Drug prices often vary depending on where the prescription is filled and some pharmacies may offer cheaper prices.  The website www.goodrx.com contains coupons for medications through different pharmacies. The prices here do not account for what the cost may be with help from insurance (it may be cheaper with your insurance), but the website can give you the price if you did not use any insurance.  - You can print the associated coupon and take it with your prescription to the pharmacy.  - You may also stop by our office during regular business hours and pick up a GoodRx coupon card.  - If you need your prescription sent electronically to a different pharmacy, notify our office through [facility] or by phone at [phone] option 4.     Si Usted Necesita Algo Despus de Su Visita  Tambin puede enviarnos un mensaje a travs de Clinical cytogeneticist. Por lo general respondemos a los mensajes de MyChart en el transcurso de 1 a 2 das hbiles.  Para renovar recetas, por favor pida a su farmacia que se ponga en contacto con nuestra oficina. Annie Sable de fax es [place] [phone].  Si tiene un asunto urgente cuando la clnica est cerrada y que no puede esperar hasta el siguiente da hbil, puede llamar/localizar a su doctor(a) al nmero que aparece a continuacin.   Por favor, tenga en cuenta que aunque hacemos todo lo posible para estar disponibles para asuntos urgentes fuera del horario de [place], no estamos disponibles las 24 horas del da, los 7  [address]  [address] de la [place].   Si tiene un problema urgente y no puede comunicarse con nosotros, puede optar por buscar atencin mdica  en el consultorio de su doctor(a), en una clnica privada, en un centro de atencin urgente o en una sala de emergencias.  Si tiene Engineer, drilling, por favor llame inmediatamente al 911 o vaya a la sala de emergencias.  Nmeros de bper  - Dr. Gwen Pounds: [phone]  - Dra. Moye: [phone]  - Dra. Roseanne Reno: [phone]  En caso de inclemencias del [place], por favor llame a Lacy Duverney principal al [phone] para una actualizacin sobre el [place] de cualquier retraso o cierre.  Consejos para la medicacin en dermatologa: Por favor, guarde las cajas en las que vienen los medicamentos de uso tpico para ayudarle a seguir las instrucciones sobre dnde y cmo usarlos. Las farmacias generalmente imprimen las instrucciones del medicamento slo en las cajas y no directamente en los tubos del [place].  Si su medicamento es muy caro, por favor, pngase en contacto con Rolm Gala llamando al [phone] y presione la opcin 4 o envenos un mensaje a travs de Clinical cytogeneticist.   No podemos decirle cul ser su copago por los medicamentos por adelantado ya que esto es diferente dependiendo de la cobertura de su seguro. Sin embargo, es posible que podamos encontrar un medicamento sustituto a Audiological scientist un formulario para que el seguro cubra el medicamento que se considera necesario.   Si se requiere una autorizacin previa para que su compaa de seguros [place] su medicamento, por favor permtanos de 1 a 2 das hbiles para completar [address]  Los precios de los medicamentos varan con frecuencia dependiendo del Environmental consultant de dnde se surte la receta y alguna farmacias pueden ofrecer precios ms baratos.  El sitio web www.goodrx.com tiene cupones para medicamentos de Health and safety inspector. Los precios aqu no tienen en cuenta lo que podra costar con  la ayuda del seguro (puede ser ms barato con su seguro), pero el sitio web puede darle el precio si no utiliz Tourist information centre manager.  - Puede imprimir el cupn correspondiente y llevarlo con su receta a la farmacia.  - Tambin puede pasar por nuestra oficina durante el horario de atencin regular y Education officer, museum una tarjeta de cupones de GoodRx.  - Si necesita que su receta se enve electrnicamente a una farmacia diferente, informe a nuestra oficina a travs de MyChart de [place] o por telfono llamando al [phone] y presione la opcin 4.

## 2022-07-19 ENCOUNTER — Other Ambulatory Visit: Payer: Self-pay | Admitting: Internal Medicine

## 2022-07-21 NOTE — Telephone Encounter (Signed)
Pt needs AWV with Dr Alphonsus Sias. Thanks

## 2022-07-21 NOTE — Telephone Encounter (Signed)
Thank you for letting us know.  

## 2022-07-21 NOTE — Telephone Encounter (Signed)
Pt stated she went ahead & switched her care to another provider outside of [place] due to hearing about [facility]'s upcoming retirement. Pt states she wishes Dr. Alphonsus Sias the best!

## 2022-07-31 ENCOUNTER — Other Ambulatory Visit: Payer: Self-pay | Admitting: Dermatology

## 2022-07-31 DIAGNOSIS — L719 Rosacea, unspecified: Secondary | ICD-10-CM

## 2022-09-24 ENCOUNTER — Other Ambulatory Visit: Payer: Self-pay | Admitting: Family Medicine

## 2022-09-24 DIAGNOSIS — Z1231 Encounter for screening mammogram for malignant neoplasm of breast: Secondary | ICD-10-CM

## 2022-09-25 ENCOUNTER — Other Ambulatory Visit: Payer: Self-pay | Admitting: *Deleted

## 2022-09-25 ENCOUNTER — Inpatient Hospital Stay
Admission: RE | Admit: 2022-09-25 | Discharge: 2022-09-25 | Disposition: A | Discharge status: Home / Self Care | Payer: Self-pay | Source: Ambulatory Visit | Attending: Family Medicine | Admitting: Family Medicine

## 2022-09-25 DIAGNOSIS — Z1231 Encounter for screening mammogram for malignant neoplasm of breast: Secondary | ICD-10-CM

## 2022-09-28 ENCOUNTER — Telehealth: Payer: Self-pay | Admitting: Gastroenterology

## 2022-09-28 NOTE — Telephone Encounter (Signed)
I spoke to pt and went over low FODMAP diet... Pt states she did not take for Lactaid and had been eating cottage cheese for 3 days straight prior to start of abd pain... Pt reports she is feeling better today and will try to stick to lactose free dairy products and take her lactaid prior to consuming dairy products... ED precautions given, pt expressed understanding... Nothing further needed at this time

## 2022-09-28 NOTE — Telephone Encounter (Signed)
Patient called in she is concerned about her episode that happen on Friday. Patient stated that on Friday afternoon she had sudden attentive abdominal pain and very loose there was bleeding patient stated that this lasted for about 36. Patient stated the bleeding got less than less patient stated that she has IBS. She also stated that this is the first time this ever happened to her. Patient stated that this happened after she ate cottage cheese and blueberry. She stated that she didn't want to go to the ER because of the wait. Patient stated that she was concern and just want clarity.

## 2022-10-01 ENCOUNTER — Ambulatory Visit
Admission: RE | Admit: 2022-10-01 | Discharge: 2022-10-01 | Disposition: A | Discharge status: Home / Self Care | Payer: Medicare Other | Source: Ambulatory Visit | Attending: Family Medicine | Admitting: Family Medicine

## 2022-10-01 DIAGNOSIS — Z1231 Encounter for screening mammogram for malignant neoplasm of breast: Secondary | ICD-10-CM | POA: Insufficient documentation

## 2022-10-06 ENCOUNTER — Inpatient Hospital Stay
Admission: RE | Admit: 2022-10-06 | Discharge: 2022-10-06 | Disposition: A | Discharge status: Home / Self Care | Payer: Self-pay | Source: Ambulatory Visit | Attending: Family Medicine | Admitting: Family Medicine

## 2022-10-06 ENCOUNTER — Other Ambulatory Visit: Payer: Self-pay | Admitting: *Deleted

## 2022-10-06 DIAGNOSIS — Z1231 Encounter for screening mammogram for malignant neoplasm of breast: Secondary | ICD-10-CM

## 2022-10-26 ENCOUNTER — Other Ambulatory Visit: Payer: Self-pay | Admitting: Dermatology

## 2022-10-26 DIAGNOSIS — L719 Rosacea, unspecified: Secondary | ICD-10-CM

## 2023-03-08 NOTE — Progress Notes (Signed)
 Cardiology Office Note  Date:  03/09/2023   ID:  Robyn Washington, DOB 1934/12/05, MRN [id number]  PCP:  Glover Lenis, MD   Chief Complaint  Patient presents with   12 month follow up     Patient c/o lightheaded in the am with positional changes, shortness of breath and pounding in chest at times; more with regular routine task, not with over exertion.     HPI:  Robyn Washington is a 89 y.o. female with a history of  SVT,  mild ro moderate aortic insufficiency, chronic venous insufficiency  Osteoporosis,back issues [employer] esoph Chronic SOB undifferentiated connective tissue disease. ,  Raynaud's who presents for follow-up of her near syncope, SOB  Last seen in clinic  December 2023 Lives at [place]  Labile blood pressure  Episodes of nausea, vision changes/lights Getting up in the Am, happened 2-3x per year Puts head between knees Sometimes low pressures   Goes to gym, rowing, bike, treadmill, Does not get SOB on machines She does get mild shortness of breath when doing activities in the house, housework  Chronic back issues, followed by Dr. Avanell  Echocardiogram October 2023 normal ejection fraction, normal RV size and function, no significant valvular heart disease,  Zio monitor Rare episodes sinus tachycardia longest 15 seconds  EKG personally reviewed by myself on todays visit EKG Interpretation Date/Time:  Tuesday March 09 2023 10:05:38 EST Ventricular Rate:  79 PR Interval:  202 QRS Duration:  62 QT Interval:  368 QTC Calculation: 421 R Axis:   89  Text Interpretation: Sinus rhythm with marked sinus arrhythmia No previous ECGs available Confirmed by Perla Lye [phone]) on 03/09/2023 10:19:55 AM    past medical history reviewed got out of bed when she felt very lightheaded and weak.  She had to sit back down.   nauseated and noticed her heart racing. She put her head between her knees. She was drenched in perspiration but did not  lose consciousness.  She walked to the bathroom and felt like she was pale when looking at herself in the mirror. She cannot identify any triggers. She slept okay the night before.  brief palpitations  prior cardiology evaluation at [facility] Did have 8 episodes of SVT--and occasional palpitations Did try vagal maneuver for this (with bad spell with diaphoresis) No chest pain or SOB  Echo 1. The left ventricle is normal in size with normal wall thickness.  2. The left ventricular systolic function is normal, LVEF is visually estimated at > 55%.  3. The aortic valve is trileaflet with mildly thickened leaflets with normal excursion.  4. There is mild to moderate aortic regurgitation.  5. The right ventricle is normal in size, with normal systolic function.  6. There is mild to moderate tricuspid regurgitation.  7. IVC size and inspiratory change suggest mildly elevated right atrial pressure. (5-10 mmHg).  PMH:   has a past medical history of Aortic valve insufficiency, Basal cell carcinoma (2014), Chronic venous insufficiency, Diverticulitis, Hypothyroidism, IBS (irritable bowel syndrome), Lumbar degenerative disc disease, Mood disorder (HCC) (2011), Osteopenia, PSVT (paroxysmal supraventricular tachycardia) (HCC), Raynaud's syndrome, and Rosacea.  PSH:    Past Surgical History:  Procedure Laterality Date   CATARACT EXTRACTION W/PHACO Left 09/24/2021   Procedure: CATARACT EXTRACTION PHACO AND INTRAOCULAR LENS PLACEMENT (IOC) LEFT VIVITY TORIC 15.68 02:02.5;  Surgeon: Mittie Gaskin, MD;  Location: [facility];  Service: Ophthalmology;  Laterality: Left;   CATARACT EXTRACTION W/PHACO Right 10/06/2021   Procedure: CATARACT EXTRACTION PHACO AND INTRAOCULAR  LENS PLACEMENT (IOC) RIGHT VIVITY TORIC 24.29 02:02.7;  Surgeon: Mittie Gaskin, MD;  Location: [facility];  Service: Ophthalmology;  Laterality: Right;   MOHS SURGERY  2017   BSC?   PILONIDAL CYST EXCISION  1958    TONSILLECTOMY AND ADENOIDECTOMY      Current Outpatient Medications  Medication Sig Dispense Refill   Azelaic Acid  15 % gel After skin is thoroughly washed and patted dry, gently but thoroughly massage a thin film of azelaic acid  cream into the affected area twice daily, in the morning and evening. 50 g 11   Calcium Carbonate-Vit D-Min (CALCIUM 600+D PLUS MINERALS) 600-400 MG-UNIT TABS Take by mouth.     denosumab (PROLIA) 60 MG/ML SOSY injection Inject 60 mg into the skin every 6 (six) months.     doxycycline  (PERIOSTAT ) 20 MG tablet TAKE 1 TABLET BY MOUTH 2 TIMES A DAY WITH FOOD AND DRINK 180 tablet 0   DULoxetine  (CYMBALTA ) 30 MG capsule TAKE 1 CAPSULE BY MOUTH DAILY (Patient taking differently: Take 20 mg by mouth daily.) 90 capsule 0   fexofenadine (ALLEGRA) 180 MG tablet Take 180 mg by mouth as needed.     levothyroxine  (SYNTHROID ) 75 MCG tablet TAKE 1 TABLET BY MOUTH  DAILY 90 tablet 3   Multiple Vitamins-Minerals (CENTRUM SILVER) tablet Take by mouth.     omeprazole  (PRILOSEC) 20 MG capsule TAKE 1 CAPSULE BY MOUTH DAILY AS NEEDED 90 capsule 2   timolol  (TIMOPTIC ) 0.5 % ophthalmic solution      triamcinolone  cream (KENALOG ) 0.1 % Apply to itchy rash 1-2 times daily until improved. Avoid face, groin, underarms. 240 g 0   No current facility-administered medications for this visit.    Allergies:   Bioflavonoids, Banana, Barley grass, Pear, Pineapple, Rice, Rye grass flower pollen extract  [gramineae pollens], Wheat, and Hydroxychloroquine   Social History:  The patient  reports that she has never smoked. She has never been exposed to tobacco smoke. She has never used smokeless tobacco. She reports that she does not drink alcohol and does not use drugs.   Family History:   family history includes Alcohol abuse in her father; Diabetes in her mother and sister; Thyroid  cancer in her daughter.   Review of Systems: Review of Systems  Constitutional: Negative.   HENT: Negative.     Respiratory: Negative.    Cardiovascular: Negative.   Gastrointestinal: Negative.   Musculoskeletal: Negative.   Neurological: Negative.   Psychiatric/Behavioral: Negative.    All other systems reviewed and are negative.  PHYSICAL EXAM: VS:  BP 120/80 (BP Location: Left Arm, Patient Position: Sitting, Cuff Size: Normal)   Pulse 79   Ht 5' 5.5 (1.664 m)   Wt 107 lb 6 oz (48.7 kg)   BMI 17.60 kg/m  , BMI Body mass index is 17.6 kg/m. Constitutional:  oriented to person, place, and time. No distress.  HENT:  Head: Grossly normal Eyes:  no discharge. No scleral icterus.  Neck: No JVD, no carotid bruits  Cardiovascular: Regular rate and rhythm, no murmurs appreciated Pulmonary/Chest: Clear to auscultation bilaterally, no wheezes or rails Abdominal: Soft.  no distension.  no tenderness.  Musculoskeletal: Normal range of motion Neurological:  normal muscle tone. Coordination normal. No atrophy Skin: Skin warm and dry Psychiatric: normal affect, pleasant  Recent Labs: No results found for requested labs within last 365 days.    Lipid Panel Lab Results  Component Value Date   CHOL 187 11/12/2020   HDL 78 11/12/2020   LDLCALC  94 11/12/2020   TRIG 86 11/12/2020    Wt Readings from Last 3 Encounters:  03/09/23 107 lb 6 oz (48.7 kg)  02/09/22 110 lb (49.9 kg)  02/03/22 110 lb 8 oz (50.1 kg)    ASSESSMENT AND PLAN:  Problem List Items Addressed This Visit       Cardiology Problems   SVT (supraventricular tachycardia) (HCC) - Primary   Relevant Orders   EKG 12-Lead (Completed)   Raynaud's syndrome   Relevant Orders   EKG 12-Lead (Completed)     Other   History of syncope   Relevant Orders   EKG 12-Lead (Completed)   Other Visit Diagnoses       Aortic valve insufficiency, etiology of cardiac valve disease unspecified       Relevant Orders   EKG 12-Lead (Completed)     Near syncope          1. Near syncope/paroxysmal tachycardia Zio monitor 2023, rare  short episodes of tachycardia In general has been asymptomatic No need for medications at this time  2. SVT (supraventricular tachycardia) Prior Zio patch monitor 4/11 showing 2.1% PVC burden as well as 8 episodes of narrow complex tachycardia. No further intervention needed, asymptomatic  3. Aortic valve insufficiency Mild to moderate on echo October 2023, relatively unchanged No change in murmur on exam  4. HTN Labile blood pressure, given episodes of near syncope associated with hypotension in the morning, will refrain from blood pressure medication  5.  Fatigue Active,  Goes to the gym at [facility], good exercise tolerance   Signed, Velinda Lunger, M.D., Ph.D. [facility] [place], [place] [phone]

## 2023-03-09 ENCOUNTER — Ambulatory Visit: Payer: Medicare Other | Attending: Cardiovascular Disease | Admitting: Cardiovascular Disease

## 2023-03-09 ENCOUNTER — Encounter: Payer: Self-pay | Admitting: Cardiovascular Disease

## 2023-03-09 VITALS — BP 120/80 | HR 79 | Ht 65.5 in | Wt 107.4 lb

## 2023-03-09 DIAGNOSIS — I471 Supraventricular tachycardia, unspecified: Secondary | ICD-10-CM | POA: Insufficient documentation

## 2023-03-09 DIAGNOSIS — I351 Nonrheumatic aortic (valve) insufficiency: Secondary | ICD-10-CM | POA: Insufficient documentation

## 2023-03-09 DIAGNOSIS — Z87898 Personal history of other specified conditions: Secondary | ICD-10-CM | POA: Insufficient documentation

## 2023-03-09 DIAGNOSIS — R55 Syncope and collapse: Secondary | ICD-10-CM | POA: Insufficient documentation

## 2023-03-09 DIAGNOSIS — I7300 Raynaud's syndrome without gangrene: Secondary | ICD-10-CM | POA: Diagnosis present

## 2023-03-09 NOTE — Patient Instructions (Signed)

## 2023-03-10 ENCOUNTER — Ambulatory Visit: Payer: Medicare Other | Admitting: Podiatry

## 2023-03-17 ENCOUNTER — Ambulatory Visit: Payer: Medicare Other | Admitting: Podiatry

## 2023-03-21 ENCOUNTER — Other Ambulatory Visit: Payer: Self-pay | Admitting: Dermatology

## 2023-03-21 DIAGNOSIS — L719 Rosacea, unspecified: Secondary | ICD-10-CM

## 2023-07-26 ENCOUNTER — Ambulatory Visit (INDEPENDENT_AMBULATORY_CARE_PROVIDER_SITE_OTHER): Payer: Medicare Other | Admitting: Dermatology

## 2023-07-26 DIAGNOSIS — L578 Other skin changes due to chronic exposure to nonionizing radiation: Secondary | ICD-10-CM | POA: Diagnosis not present

## 2023-07-26 DIAGNOSIS — L820 Inflamed seborrheic keratosis: Secondary | ICD-10-CM | POA: Diagnosis not present

## 2023-07-26 DIAGNOSIS — L300 Nummular dermatitis: Secondary | ICD-10-CM | POA: Diagnosis not present

## 2023-07-26 DIAGNOSIS — I781 Nevus, non-neoplastic: Secondary | ICD-10-CM

## 2023-07-26 DIAGNOSIS — L719 Rosacea, unspecified: Secondary | ICD-10-CM

## 2023-07-26 DIAGNOSIS — L814 Other melanin hyperpigmentation: Secondary | ICD-10-CM

## 2023-07-26 DIAGNOSIS — R21 Rash and other nonspecific skin eruption: Secondary | ICD-10-CM

## 2023-07-26 DIAGNOSIS — D692 Other nonthrombocytopenic purpura: Secondary | ICD-10-CM

## 2023-07-26 DIAGNOSIS — W908XXA Exposure to other nonionizing radiation, initial encounter: Secondary | ICD-10-CM

## 2023-07-26 DIAGNOSIS — Z85828 Personal history of other malignant neoplasm of skin: Secondary | ICD-10-CM

## 2023-07-26 DIAGNOSIS — Z1283 Encounter for screening for malignant neoplasm of skin: Secondary | ICD-10-CM | POA: Diagnosis not present

## 2023-07-26 DIAGNOSIS — L738 Other specified follicular disorders: Secondary | ICD-10-CM

## 2023-07-26 DIAGNOSIS — D229 Melanocytic nevi, unspecified: Secondary | ICD-10-CM

## 2023-07-26 DIAGNOSIS — L821 Other seborrheic keratosis: Secondary | ICD-10-CM

## 2023-07-26 DIAGNOSIS — D1801 Hemangioma of skin and subcutaneous tissue: Secondary | ICD-10-CM

## 2023-07-26 MED ORDER — DOXYCYCLINE HYCLATE 20 MG PO TABS: ORAL_TABLET | ORAL | 3 refills | Status: AC

## 2023-07-26 MED ORDER — TRIAMCINOLONE ACETONIDE 0.1 % EX CREA: TOPICAL_CREAM | CUTANEOUS | 1 refills | Status: AC

## 2023-07-26 MED ORDER — AZELAIC ACID 15 % EX GEL: CUTANEOUS | 11 refills | Status: AC

## 2023-07-26 NOTE — Progress Notes (Signed)
 Follow-Up Visit   Subjective  Robyn Washington is a 89 y.o. female who presents for the following: Skin Cancer Screening and Full Body Skin Exam  The patient presents for Total-Body Skin Exam (TBSE) for skin cancer screening and mole check. The patient has spots, moles and lesions to be evaluated, some may be new or changing. Patient has several spots to check today, has a list. Spots in scalp are irritating and she picks at them. History of BCC of the right forehead, 2014. She uses azelaic acid  15% and doxycycline  20 mg twice daily for rosacea.  Pt states she has been diagnosed with mixed connective tissue disease and has Raynaud's   The following portions of the chart were reviewed this encounter and updated as appropriate: medications, allergies, medical history  Review of Systems:  No other skin or systemic complaints except as noted in HPI or Assessment and Plan.  Objective  Well appearing patient in no apparent distress; mood and affect are within normal limits.  A full examination was performed including scalp, head, eyes, ears, nose, lips, neck, chest, axillae, abdomen, back, buttocks, bilateral upper extremities, bilateral lower extremities, hands, feet, fingers, toes, fingernails, and toenails. All findings within normal limits unless otherwise noted below.   Relevant physical exam findings are noted in the Assessment and Plan.  R temporal scalp x 1, R postauricular scalp x 1, L frontal hairline x 1 (3) Erythematous stuck-on, waxy papule  Assessment & Plan   SKIN CANCER SCREENING PERFORMED TODAY.  ACTINIC DAMAGE - Chronic condition, secondary to cumulative UV/sun exposure - diffuse scaly erythematous macules with underlying dyspigmentation - Recommend daily broad spectrum sunscreen SPF 30+ to sun-exposed areas, reapply every 2 hours as needed.  - Staying in the shade or wearing long sleeves, sun glasses (UVA+UVB protection) and wide brim hats (4-inch brim around the  entire circumference of the hat) are also recommended for sun protection.  - Call for new or changing lesions.  LENTIGINES, SEBORRHEIC KERATOSES, HEMANGIOMAS - Benign normal skin lesions - Benign-appearing - Call for any changes  MELANOCYTIC NEVI - Tan-brown and/or pink-flesh-colored symmetric macules and papules - Benign appearing on exam today - Observation - Call clinic for new or changing moles - Recommend daily use of broad spectrum spf 30+ sunscreen to sun-exposed areas.   History of Basal Cell Carcinoma of the Skin Right forehead 2014 - No evidence of recurrence today - Recommend regular full body skin exams - Recommend daily broad spectrum sunscreen SPF 30+ to sun-exposed areas, reapply every 2 hours as needed.  - Call if any new or changing lesions are noted between office visits   Sebaceous Hyperplasia - Small yellow papules with a central dell at R inf chin, R nasal root - Benign-appearing - Observe. Call for changes. - May try azelaic acid  15% gel.  Purpura vs Hemangioma  - Violaceous macule at left zygoma - Benign - Observe - Can use OTC arnica containing moisturizer such as Dermend Bruise Formula if desired - Call for worsening or other concerns  TELANGIECTASIA Exam: dilated blood vessel at L nasal tip  Treatment Plan: Benign appearing on exam Call for changes  ROSACEA Exam Mid face erythema with telangiectasias   Chronic and persistent condition with duration or expected duration over one year. Condition is improving with treatment but not currently at goal.  Rosacea is a chronic progressive skin condition usually affecting the face of adults, causing redness and/or acne bumps. It is treatable but not curable. It sometimes affects the  eyes (ocular rosacea) as well. It may respond to topical and/or systemic medication and can flare with stress, sun exposure, alcohol, exercise, topical steroids (including hydrocortisone/cortisone 10) and some foods.  Daily  application of broad spectrum spf 30+ sunscreen to face is recommended to reduce flares.  Patient denies grittiness of the eyes  Counseling for BBL / IPL / Laser and Coordination of Care Discussed the treatment option of Broad Band Light (BBL) /Intense Pulsed Light (IPL)/ Laser for skin discoloration, including brown spots and redness.  Typically we recommend at least 1-3 treatment sessions about 5-8 weeks apart for best results.  Cannot have tanned skin when BBL performed, and regular use of sunscreen/photoprotection is advised after the procedure to help maintain results. The patient's condition may also require "maintenance treatments" in the future.  The fee for BBL / laser treatments is $350 per treatment session for the whole face.  A fee can be quoted for other parts of the body.  Insurance typically does not pay for BBL/laser treatments and therefore the fee is an out-of-pocket cost. Recommend prophylactic valtrex treatment. Once scheduled for procedure, will send Rx in prior to patient's appointment.   Treatment Plan Continue Azelaic acid  15% gel every day dsp 50g 1 yr rf. Continue doxycycline  20 MG take 1 po bid dsp #60 1 yr rf. Doxycycline  should be taken with food to prevent nausea. Do not lay down for 30 minutes after taking. Be cautious with sun exposure and use good sun protection while on this medication. Pregnant women should not take this medication.   Discussed Rhofade, pt defers today. For temporary redness reduction Mix 1 bottle of Afrin original in 1 bottle of Cerave PM, shake well, and apply 1 hour before you want redness to be improved in the morning   NUMMULAR DERMATITIS  Exam: Pink excoriated papules and patches upper back.  Chronic and persistent condition with duration or expected duration over one year. Condition is symptomatic / bothersome to patient. Not to goal.  Nummular dermatitis (eczema) is a chronic, relapsing, pruritic condition that can significantly  affect quality of life. It is often associated with dry skin can require treatment with prescription topical medications, in addition to dry skin care.  If there is underlying atopic dermatitis and topicals are not working, then biologic injections may be necessary to control symptoms.  Treatment Plan: Restart TMC 0.1% Cream Apply once to twice daily to itchy spots on back until improved dsp 80g 1Rf.Aaron Aas Avoid applying to face, groin, and axilla. Use as directed. Long-term use can cause thinning of the skin.  Recommend mild soap and moisturizing cream 1-2 times daily.   INFLAMED SEBORRHEIC KERATOSIS (3) R temporal scalp x 1, R postauricular scalp x 1, L frontal hairline x 1 (3) Symptomatic, irritating, patient would like treated. Destruction of lesion - R temporal scalp x 1, R postauricular scalp x 1, L frontal hairline x 1 (3)  Destruction method: cryotherapy   Informed consent: discussed and consent obtained   Lesion destroyed using liquid nitrogen: Yes   Region frozen until ice ball extended beyond lesion: Yes   Outcome: patient tolerated procedure well with no complications   Post-procedure details: wound care instructions given   Additional details:  Prior to procedure, discussed risks of blister formation, small wound, skin dyspigmentation, or rare scar following cryotherapy. Recommend Vaseline ointment to treated areas while healing.  ROSACEA   Related Medications Azelaic Acid  15 % gel After skin is thoroughly washed and patted dry, gently but thoroughly  massage a thin film of azelaic acid  cream into the affected area twice daily, in the morning and evening. doxycycline  (PERIOSTAT ) 20 MG tablet TAKE 1 TABLET BY MOUTH 2 TIMES A DAY WITH FOOD AND DRINK RASH   Related Medications triamcinolone  cream (KENALOG ) 0.1 % Apply to itchy rash 1-2 times daily until improved. Avoid face, groin, underarms. Return in about 1 year (around 07/25/2024) for TBSE, Hx BCC.  IBernardine Bridegroom, CMA, am  acting as scribe for Artemio Larry, MD .   Documentation: I have reviewed the above documentation for accuracy and completeness, and I agree with the above.  Artemio Larry, MD

## 2023-07-26 NOTE — Patient Instructions (Addendum)
 Cryotherapy Aftercare  Wash gently with soap and water everyday.   Apply Vaseline and Band-Aid daily until healed.    For temporary redness reduction Mix 1 bottle of Afrin original in 1 bottle of Cerave PM, shake well, and apply 1 hour before you want redness to be improved in the morning  Counseling for BBL / IPL / Laser and Coordination of Care Discussed the treatment option of Broad Band Light (BBL) /Intense Pulsed Light (IPL)/ Laser for skin discoloration, including brown spots and redness.  Typically we recommend at least 1-3 treatment sessions about 5-8 weeks apart for best results.  Cannot have tanned skin when BBL performed, and regular use of sunscreen/photoprotection is advised after the procedure to help maintain results. The patient's condition may also require "maintenance treatments" in the future.  The fee for BBL / laser treatments is $350 per treatment session for the whole face.  A fee can be quoted for other parts of the body.  Insurance typically does not pay for BBL/laser treatments and therefore the fee is an out-of-pocket cost. Recommend prophylactic valtrex treatment. Once scheduled for procedure, will send Rx in prior to patient's appointment.   Rosacea  What is rosacea? Rosacea (say: ro-zay-sha) is a common skin disease that usually begins as a trend of flushing or blushing easily.  As rosacea progresses, a persistent redness in the center of the face will develop and may gradually spread beyond the nose and cheeks to the forehead and chin.  In some cases, the ears, chest, and back could be affected.  Rosacea may appear as tiny blood vessels or small red bumps that occur in crops.  Frequently they can contain pus, and are called "pustules".  If the bumps do not contain pus, they are referred to as "papules".  Rarely, in prolonged, untreated cases of rosacea, the oil glands of the nose and cheeks may become permanently enlarged.  This is called rhinophyma, and is seen more  frequently in men.  Signs and Risks In its beginning stages, rosacea tends to come and go, which makes it difficult to recognize.  It can start as intermittent flushing of the face.  Eventually, blood vessels may become permanently visible.  Pustules and papules can appear, but can be mistaken for adult acne.  People of all races, ages, genders and ethnic groups are at risk of developing rosacea.  However, it is more common in women (especially around menopause) and adults with fair skin between the ages of 64 and 48.  Treatment Dermatologists typically recommend a combination of treatments to effectively manage rosacea.  Treatment can improve symptoms and may stop the progression of the rosacea.  Treatment may involve both topical and oral medications.  The tetracycline antibiotics are often used for their anti-inflammatory effect; however, because of the possibility of developing antibiotic resistance, they should not be used long term at full dose.  For dilated blood vessels the options include electrodessication (uses electric current through a small needle), laser treatment, and cosmetics to hide the redness.   With all forms of treatment, improvement is a slow process, and patients may not see any results for the first 3-4 weeks.  It is very important to avoid the sun and other triggers.  Patients must wear sunscreen daily.  Skin Care Instructions: Cleanse the skin with a mild soap such as CeraVe cleanser, Cetaphil cleanser, or Dove soap once or twice daily as needed. Moisturize with Eucerin Redness Relief Daily Perfecting Lotion (has a subtle green tint), CeraVe  Moisturizing Cream, or Oil of Olay Daily Moisturizer with sunscreen every morning and/or night as recommended. Makeup should be "non-comedogenic" (won't clog pores) and be labeled "for sensitive skin". Good choices for cosmetics are: Neutrogena, Almay, and Physician's Formula.  Any product with a green tint tends to offset a red  complexion. If your eyes are dry and irritated, use artificial tears 2-3 times per day and cleanse the eyelids daily with baby shampoo.  Have your eyes examined at least every 2 years.  Be sure to tell your eye doctor that you have rosacea. Alcoholic beverages tend to cause flushing of the skin, and may make rosacea worse. Always wear sunscreen, protect your skin from extreme hot and cold temperatures, and avoid spicy foods, hot drinks, and mechanical irritation such as rubbing, scrubbing, or massaging the face.  Avoid harsh skin cleansers, cleansing masks, astringents, and exfoliation. If a particular product burns or makes your face feel tight, then it is likely to flare your rosacea. If you are having difficulty finding a sunscreen that you can tolerate, you may try switching to a chemical-free sunscreen.  These are ones whose active ingredient is zinc oxide or titanium dioxide only.  They should also be fragrance free, non-comedogenic, and labeled for sensitive skin. Rosacea triggers may vary from person to person.  There are a variety of foods that have been reported to trigger rosacea.  Some patients find that keeping a diary of what they were doing when they flared helps them avoid triggers.   Due to recent changes in healthcare laws, you may see results of your pathology and/or laboratory studies on MyChart before the doctors have had a chance to review them. We understand that in some cases there may be results that are confusing or concerning to you. Please understand that not all results are received at the same time and often the doctors may need to interpret multiple results in order to provide you with the best plan of care or course of treatment. Therefore, we ask that you please give us  2 business days to thoroughly review all your results before contacting the office for clarification. Should we see a critical lab result, you will be contacted sooner.   If You Need Anything After Your  Visit  If you have any questions or concerns for your doctor, please call our main line at [phone] and press option 4 to reach your doctor's medical assistant. If no one answers, please leave a voicemail as directed and we will return your call as soon as possible. Messages left after 4 pm will be answered the following business day.   You may also send us  a message via MyChart. We typically respond to MyChart messages within 1-2 business days.  For prescription refills, please ask your pharmacy to contact our office. Our fax number is [phone].  If you have an urgent issue when the clinic is closed that cannot wait until the next business day, you can page your doctor at the number below.    Please note that while we do our best to be available for urgent issues outside of office hours, we are not available 24/7.   If you have an urgent issue and are unable to reach us , you may choose to seek medical care at your doctor's office, retail clinic, urgent care center, or emergency room.  If you have a medical emergency, please immediately call 911 or go to the emergency department.  Pager Numbers  - Dr. Bary Likes: [phone]  -  Dr. Annette Barters: [phone]  - Dr. Felipe Horton: [phone]   In the event of inclement weather, please call our main line at [phone] for an update on the status of any delays or closures.  Dermatology Medication Tips: Please keep the boxes that topical medications come in in order to help keep track of the instructions about where and how to use these. Pharmacies typically print the medication instructions only on the boxes and not directly on the medication tubes.   If your medication is too expensive, please contact our office at [phone] option 4 or send us  a message through MyChart.   We are unable to tell what your co-pay for medications will be in advance as this is different depending on your insurance coverage. However, we may be able to find a  substitute medication at lower cost or fill out paperwork to get insurance to cover a needed medication.   If a prior authorization is required to get your medication covered by your insurance company, please allow us  1-2 business days to complete this process.  Drug prices often vary depending on where the prescription is filled and some pharmacies may offer cheaper prices.  The website www.goodrx.com contains coupons for medications through different pharmacies. The prices here do not account for what the cost may be with help from insurance (it may be cheaper with your insurance), but the website can give you the price if you did not use any insurance.  - You can print the associated coupon and take it with your prescription to the pharmacy.  - You may also stop by our office during regular business hours and pick up a GoodRx coupon card.  - If you need your prescription sent electronically to a different pharmacy, notify our office through [facility] or by phone at [phone] option 4.     Si Usted Necesita Algo Despus de Su Visita  Tambin puede enviarnos un mensaje a travs de Clinical cytogeneticist. Por lo general respondemos a los mensajes de MyChart en el transcurso de 1 a 2 das hbiles.  Para renovar recetas, por favor pida a su farmacia que se ponga en contacto con nuestra oficina. Franz Jacks de fax es [place] [phone].  Si tiene un asunto urgente cuando la clnica est cerrada y que no puede esperar hasta el siguiente da hbil, puede llamar/localizar a su doctor(a) al nmero que aparece a continuacin.   Por favor, tenga en cuenta que aunque hacemos todo lo posible para estar disponibles para asuntos urgentes fuera del horario de [place], no estamos disponibles las 24 horas del da, los 7 [address]  [address] de la [place].   Si tiene un problema urgente y no puede comunicarse con nosotros, puede optar por buscar atencin mdica  en el consultorio de su doctor(a), en una clnica privada, en un  centro de atencin urgente o en una sala de emergencias.  Si tiene Engineer, drilling, por favor llame inmediatamente al 911 o vaya a la sala de emergencias.  Nmeros de bper  - Dr. Bary Likes: [phone]  - Dra. Annette Barters: [phone]  - Dr. Felipe Horton: [phone]   En caso de inclemencias del tiempo, por favor llame a Lajuan Pila principal al [phone] para una actualizacin sobre el [place] de cualquier retraso o cierre.  Consejos para la medicacin en dermatologa: Por favor, guarde las cajas en las que vienen los medicamentos de uso tpico para ayudarle a seguir las instrucciones sobre dnde y cmo usarlos. Las farmacias generalmente imprimen las instrucciones del medicamento slo en las  cajas y no directamente en los tubos del medicamento.   Si su medicamento es muy caro, por favor, pngase en contacto con Bettyjane Brunet llamando al [phone] y presione la opcin 4 o envenos un mensaje a travs de Clinical cytogeneticist.   No podemos decirle cul ser su copago por los medicamentos por adelantado ya que esto es diferente dependiendo de la cobertura de su seguro. Sin embargo, es posible que podamos encontrar un medicamento sustituto a Audiological scientist un formulario para que el seguro cubra el medicamento que se considera necesario.   Si se requiere una autorizacin previa para que su compaa de seguros [place] su medicamento, por favor permtanos de 1 a 2 das hbiles para completar este proceso.  Los precios de los medicamentos varan con frecuencia dependiendo del Environmental consultant de dnde se surte la receta y alguna farmacias pueden ofrecer precios ms baratos.  El sitio web www.goodrx.com tiene cupones para medicamentos de Health and safety inspector. Los precios aqu no tienen en cuenta lo que podra costar con la ayuda del seguro (puede ser ms barato con su seguro), pero el sitio web puede darle el precio si no utiliz Tourist information centre manager.  - Puede imprimir el cupn correspondiente y llevarlo con su  receta a la farmacia.  - Tambin puede pasar por nuestra oficina durante el horario de atencin regular y Education officer, museum una tarjeta de cupones de GoodRx.  - Si necesita que su receta se enve electrnicamente a una farmacia diferente, informe a nuestra oficina a travs de MyChart de [place] o por telfono llamando al [phone] y presione la opcin 4.

## 2023-08-04 ENCOUNTER — Other Ambulatory Visit: Payer: Self-pay | Admitting: Dermatology

## 2023-08-04 DIAGNOSIS — L719 Rosacea, unspecified: Secondary | ICD-10-CM

## 2023-08-19 ENCOUNTER — Other Ambulatory Visit: Payer: Self-pay | Admitting: Family Medicine

## 2023-08-19 DIAGNOSIS — Z1231 Encounter for screening mammogram for malignant neoplasm of breast: Secondary | ICD-10-CM

## 2023-09-29 ENCOUNTER — Ambulatory Visit: Admitting: Dermatology

## 2023-09-29 DIAGNOSIS — L820 Inflamed seborrheic keratosis: Secondary | ICD-10-CM

## 2023-09-29 DIAGNOSIS — L821 Other seborrheic keratosis: Secondary | ICD-10-CM | POA: Diagnosis not present

## 2023-09-29 DIAGNOSIS — L814 Other melanin hyperpigmentation: Secondary | ICD-10-CM | POA: Diagnosis not present

## 2023-09-29 NOTE — Progress Notes (Signed)
   Follow-Up Visit   Subjective  Robyn Washington is a 89 y.o. female who presents for the following: Recheck ISK on the R temporal scalp pt does pick at lesion, the L frontal hairline and R postauricular scalp ISKs have resolved. Pt has a new, similar skin lesion around the R sideburn area that she would like checked today too.   The following portions of the chart were reviewed this encounter and updated as appropriate: medications, allergies, medical history  Review of Systems:  No other skin or systemic complaints except as noted in HPI or Assessment and Plan.  Objective  Well appearing patient in no apparent distress; mood and affect are within normal limits.   A focused examination was performed of the following areas: the face and scalp   Relevant exam findings are noted in the Assessment and Plan.  R temporal scalp x 1 Stuck-on, waxy, tan-brown papule --Discussed benign etiology and prognosis.   Assessment & Plan   SEBORRHEIC KERATOSIS - Stuck-on, waxy, tan-brown papules and/or plaques  - Benign-appearing - Discussed benign etiology and prognosis. - Observe - Call for any changes INFLAMED SEBORRHEIC KERATOSIS R temporal scalp x 1 Residual ISK with prurigo nodule of the R temporal scalp. Symptomatic, irritating, patient would like treated.    Once healed, apply TMC 0.1% cream to aa QD-BID PRN itch (pt has at home already). Topical steroids (such as triamcinolone , fluocinolone, fluocinonide, mometasone, clobetasol, halobetasol, betamethasone, hydrocortisone) can cause thinning and lightening of the skin if they are used for too long in the same area. Your physician has selected the right strength medicine for your problem and area affected on the body. Please use your medication only as directed by your physician to prevent side effects.   Avoid picking/rubbing area  Destruction of lesion - R temporal scalp x 1  Destruction method: cryotherapy   Informed consent:  discussed and consent obtained   Lesion destroyed using liquid nitrogen: Yes   Region frozen until ice ball extended beyond lesion: Yes   Outcome: patient tolerated procedure well with no complications   Post-procedure details: wound care instructions given   Additional details:  Prior to procedure, discussed risks of blister formation, small wound, skin dyspigmentation, or rare scar following cryotherapy. Recommend Vaseline ointment to treated areas while healing.     LENTIGINES vs flat SK Exam: scattered tan macules R temple with underlying prominent veins,  Left temple  Due to sun exposure Treatment Plan: Benign-appearing, observe. Recommend daily broad spectrum sunscreen SPF 30+ to sun-exposed areas, reapply every 2 hours as needed.  Call for any changes. May continue topical Azelaic acid  gel daily.   LILLETTE Rosina Mayans, CMA, am acting as scribe for Rexene Rattler, MD .  Return for appointment as scheduled.   Documentation: I have reviewed the above documentation for accuracy and completeness, and I agree with the above.  Rexene Rattler, MD

## 2023-09-29 NOTE — Patient Instructions (Signed)

## 2023-10-04 ENCOUNTER — Ambulatory Visit
Admission: RE | Admit: 2023-10-04 | Discharge: 2023-10-04 | Disposition: A | Discharge status: Home / Self Care | Source: Ambulatory Visit | Attending: Family Medicine | Admitting: Family Medicine

## 2023-10-04 DIAGNOSIS — Z1231 Encounter for screening mammogram for malignant neoplasm of breast: Secondary | ICD-10-CM | POA: Insufficient documentation

## 2024-03-01 ENCOUNTER — Ambulatory Visit (INDEPENDENT_AMBULATORY_CARE_PROVIDER_SITE_OTHER)

## 2024-03-01 DIAGNOSIS — L738 Other specified follicular disorders: Secondary | ICD-10-CM | POA: Diagnosis not present

## 2024-03-01 DIAGNOSIS — L814 Other melanin hyperpigmentation: Secondary | ICD-10-CM

## 2024-03-01 DIAGNOSIS — L720 Epidermal cyst: Secondary | ICD-10-CM | POA: Diagnosis not present

## 2024-03-01 DIAGNOSIS — L820 Inflamed seborrheic keratosis: Secondary | ICD-10-CM

## 2024-03-01 DIAGNOSIS — L821 Other seborrheic keratosis: Secondary | ICD-10-CM

## 2024-03-01 NOTE — Progress Notes (Signed)
" °  °  Subjective   Robyn Washington is a 89 y.o. female who presents for the following: Lesion(s) of concern . Patient is established patient   Today patient reports: Currently has a spot appeared in November on left temple, has been getting larger. She states they are itchy left temple, consistently scratching, started bleeding, has been applying azelaic acid . Hx of BCC tx w/ MOHS- 2014 at forehead, hx rosacea is following w/ Dr. Jackquline for treatment.   Review of Systems:    No other skin or systemic complaints except as noted in HPI or Assessment and Plan.  The following portions of the chart were reviewed this encounter and updated as appropriate: medications, allergies, medical history  Relevant Medical History:  Hx of rosacea  Objective  (SKPE) Well appearing patient in no apparent distress; mood and affect are within normal limits. Examination was performed of the: Focused Exam of: face   Examination notable for: Lentigo/lentigines: Scattered pigmented macules that are tan to brown in color and are somewhat non-uniform in shape and concentrated in the sun-exposed areas, Seborrheic Keratosis(es): Stuck-on appearing keratotic papule(s) on the trunk, some  irritated with redness, crusting, edema, and/or partial avulsion, Milia: Multiple 2-3 mm white papules , Sebaceous Gland Hyperplasia: Multiple flesh-colored to yellow delled papules 2-29mm in size over forehead and midface  Examination limited by: Undergarments, Shoes or socks , and Clothing     Assessment & Plan  (SKAP)   Benign Lesions/ Findings:  -Seborrheic Keratosis -Milia  -Sebaceous gland hyperplasia -Lentigines - Reassurance provided regarding the benign appearance of lesions noted on exam today; no treatment is indicated in the absence of symptoms/changes. - Reinforced importance of photoprotective strategies including liberal and frequent sunscreen use of a broad-spectrum SPF 30 or greater, use of protective clothing,  and sun avoidance for prevention of cutaneous malignancy and photoaging.  Counseled patient on the importance of regular self-skin monitoring as well as routine clinical skin examinations as scheduled.       Was sun protection counseling provided?: Yes   Level of service outlined above   Patient instructions (SKPI)   Procedures, orders, diagnosis for this visit:  INFLAMED SEBORRHEIC KERATOSIS (2) Left Temple x2 (2) - Destruction of lesion - Left Temple x2 (2) Complexity: simple   Destruction method: cryotherapy   Informed consent: discussed and consent obtained   Timeout:  patient name, date of birth, surgical site, and procedure verified Lesion destroyed using liquid nitrogen: Yes   Region frozen until ice ball extended beyond lesion: Yes   Cryo cycles: 1 or 2. Outcome: patient tolerated procedure well with no complications   Post-procedure details: wound care instructions given   Additional details:  Prior to procedure, discussed risks of blister formation, small wound, skin dyspigmentation, or rare scar following cryotherapy. Recommend Vaseline ointment to treated areas while healing.    Inflamed seborrheic keratosis -     Destruction of lesion    Return to clinic: Return for As scheduled, w/ Dr. Jackquline.  I, Almetta Nora, RMA, am acting as scribe for Lauraine JAYSON Kanaris, MD .   Documentation: I have reviewed the above documentation for accuracy and completeness, and I agree with the above.  Lauraine JAYSON Kanaris, MD  "

## 2024-03-01 NOTE — Patient Instructions (Addendum)
 - Milia  Seborrheic Keratosis  What causes seborrheic keratoses? Seborrheic keratoses are harmless, common skin growths that first appear during adult life.  As time goes by, more growths appear.  Some people may develop a large number of them.  Seborrheic keratoses appear on both covered and uncovered body parts.  They are not caused by sunlight.  The tendency to develop seborrheic keratoses can be inherited.  They vary in color from skin-colored to gray, brown, or even black.  They can be either smooth or have a rough, warty surface.   Seborrheic keratoses are superficial and look as if they were stuck on the skin.  Under the microscope this type of keratosis looks like layers upon layers of skin.  That is why at times the top layer may seem to fall off, but the rest of the growth remains and re-grows.    Treatment Seborrheic keratoses do not need to be treated, but can easily be removed in the office.  Seborrheic keratoses often cause symptoms when they rub on clothing or jewelry.  Lesions can be in the way of shaving.  If they become inflamed, they can cause itching, soreness, or burning.  Removal of a seborrheic keratosis can be accomplished by freezing, burning, or surgery. If any spot bleeds, scabs, or grows rapidly, please return to have it checked, as these can be an indication of a skin cancer.  Due to recent changes in healthcare laws, you may see results of your pathology and/or laboratory studies on MyChart before the doctors have had a chance to review them. We understand that in some cases there may be results that are confusing or concerning to you. Please understand that not all results are received at the same time and often the doctors may need to interpret multiple results in order to provide you with the best plan of care or course of treatment. Therefore, we ask that you please give us  2 business days to thoroughly review all your results before contacting the office for  clarification. Should we see a critical lab result, you will be contacted sooner.   If You Need Anything After Your Visit  If you have any questions or concerns for your doctor, please call our main line at [phone] and press option 4 to reach your doctor's medical assistant. If no one answers, please leave a voicemail as directed and we will return your call as soon as possible. Messages left after 4 pm will be answered the following business day.   You may also send us  a message via MyChart. We typically respond to MyChart messages within 1-2 business days.  For prescription refills, please ask your pharmacy to contact our office. Our fax number is [phone].  If you have an urgent issue when the clinic is closed that cannot wait until the next business day, you can page your doctor at the number below.    Please note that while we do our best to be available for urgent issues outside of office hours, we are not available 24/7.   If you have an urgent issue and are unable to reach us , you may choose to seek medical care at your doctor's office, retail clinic, urgent care center, or emergency room.  If you have a medical emergency, please immediately call 911 or go to the emergency department.  Pager Numbers  - Dr. Hester: [phone]  - Dr. Jackquline: [phone]  - Dr. Claudene: [phone]   - Dr. Raymund: [phone]  In the event  of inclement weather, please call our main line at [phone] for an update on the status of any delays or closures.  Dermatology Medication Tips: Please keep the boxes that topical medications come in in order to help keep track of the instructions about where and how to use these. Pharmacies typically print the medication instructions only on the boxes and not directly on the medication tubes.   If your medication is too expensive, please contact our office at [phone] option 4 or send us  a message through MyChart.   We are unable to  tell what your co-pay for medications will be in advance as this is different depending on your insurance coverage. However, we may be able to find a substitute medication at lower cost or fill out paperwork to get insurance to cover a needed medication.   If a prior authorization is required to get your medication covered by your insurance company, please allow us  1-2 business days to complete this process.  Drug prices often vary depending on where the prescription is filled and some pharmacies may offer cheaper prices.  The website www.goodrx.com contains coupons for medications through different pharmacies. The prices here do not account for what the cost may be with help from insurance (it may be cheaper with your insurance), but the website can give you the price if you did not use any insurance.  - You can print the associated coupon and take it with your prescription to the pharmacy.  - You may also stop by our office during regular business hours and pick up a GoodRx coupon card.  - If you need your prescription sent electronically to a different pharmacy, notify our office through [facility] or by phone at [phone] option 4.     Si Usted Necesita Algo Despus de Su Visita  Tambin puede enviarnos un mensaje a travs de Clinical Cytogeneticist. Por lo general respondemos a los mensajes de MyChart en el transcurso de 1 a 2 das hbiles.  Para renovar recetas, por favor pida a su farmacia que se ponga en contacto con nuestra oficina. [place]i [place] de fax es [place] [phone].  Si tiene un asunto urgente cuando la clnica est cerrada y que no puede esperar hasta el siguiente da hbil, puede llamar/localizar a su doctor(a) al nmero que aparece a continuacin.   Por favor, tenga en cuenta que aunque hacemos todo lo posible para estar disponibles para asuntos urgentes fuera del horario de [place], no estamos disponibles las 24 horas del da, los 7 [address]  [phone] de la [place].   Si tiene un problema  urgente y no puede comunicarse con nosotros, puede optar por buscar atencin mdica  en el consultorio de su doctor(a), en una clnica privada, en un centro de atencin urgente o en una sala de emergencias.  Si tiene engineer, drilling, por favor llame inmediatamente al 911 o vaya a la sala de emergencias.  Nmeros de bper  - Dr. Hester: [phone]  - Dra. Jackquline: [phone]  - Dr. Claudene: [phone]  - Dra. Kitts: [phone]  En caso de inclemencias del [place], por favor llame a nuestra lnea principal al [phone] para una actualizacin sobre el estado de cualquier retraso o cierre.  Consejos para la medicacin en dermatologa: Por favor, guarde las cajas en las que vienen los medicamentos de uso tpico para ayudarle a seguir las instrucciones sobre dnde y cmo usarlos. Las farmacias generalmente imprimen las instrucciones del medicamento slo en las cajas y no directamente en los tubos del [place].  Si su medicamento es muy caro, por favor, pngase en contacto con landry rieger llamando al [phone] y presione la opcin 4 o envenos un mensaje a travs de Clinical Cytogeneticist.   No podemos decirle cul ser su copago por los medicamentos por adelantado ya que esto es diferente dependiendo de la cobertura de su seguro. Sin embargo, es posible que podamos encontrar un medicamento sustituto a audiological scientist un formulario para que el seguro cubra el medicamento que se considera necesario.   Si se requiere una autorizacin previa para que su compaa de seguros cubra su medicamento, por favor permtanos de 1 a 2 das hbiles para completar este proceso.  Los precios de los medicamentos varan con frecuencia dependiendo del environmental consultant de dnde se surte la receta y alguna farmacias pueden ofrecer precios ms baratos.  El sitio web www.goodrx.com tiene cupones para medicamentos de health and safety inspector. Los precios aqu no tienen en cuenta lo que podra costar con la ayuda del  seguro (puede ser ms barato con su seguro), pero el sitio web puede darle el precio si no utiliz tourist information centre manager.  - Puede imprimir el cupn correspondiente y llevarlo con su receta a la farmacia.  - Tambin puede pasar por nuestra oficina durante el horario de atencin regular y education officer, museum una tarjeta de cupones de GoodRx.  - Si necesita que su receta se enve electrnicamente a una farmacia diferente, informe a nuestra oficina a travs de MyChart de [place] o por telfono llamando al [phone] y presione la opcin 4.

## 2024-03-07 DIAGNOSIS — R0602 Shortness of breath: Secondary | ICD-10-CM | POA: Insufficient documentation

## 2024-03-07 DIAGNOSIS — R55 Syncope and collapse: Secondary | ICD-10-CM | POA: Insufficient documentation

## 2024-03-07 NOTE — Progress Notes (Unsigned)
 Cardiology Office Note  Date:  03/10/2024   ID:  Robyn Washington, DOB Jul 15, 1934, MRN [id number] Yeah thank you thank you she is in good PCP:  Robyn Lenis, MD   Chief Complaint  Patient presents with   12 month follow up     Patient c/o shortness of breath at rest.     HPI:  Robyn Washington is a 89 y.o. female with a history of  SVT,  mild to moderate aortic insufficiency, chronic venous insufficiency  Osteoporosis,back issues [employer] esoph Chronic SOB undifferentiated connective tissue disease ,  Raynaud's who presents for follow-up of her near syncope, SOB, labile hypertension, aortic valve regurgitation  Last seen in clinic  1/25 Lives at [place] Lost husband 2019 shortly after moving into [employer] vigorous exercise program  Periodic labile blood pressure, sometimes low, other times running high Has propranolol to take if blood pressure runs high and sustains  Previously reported orthostasis symptoms Will typically present when getting up in the Am, happened 2-3x per year Puts head between knees Sometimes low pressures  Takes salt and fluids and symptoms eventually resolve  Goes to gym, rowing, bike, treadmill 30 min 3.2 speed Bought nordic walking poles for balance  Does not get SOB on machines Mild shortness of breath doing house activities, housework  Chronic back issues, followed by Dr. Avanell Followed by [facility] Tobie at [facility],  For raynauds  Echocardiogram October 2023 normal ejection fraction, normal RV size and function, no significant valvular heart disease,  Zio monitor Rare episodes sinus tachycardia longest 15 seconds  EKG personally reviewed by myself on todays visit EKG Interpretation Date/Time:  Friday March 10 2024 09:05:46 EST Ventricular Rate:  62 PR Interval:  180 QRS Duration:  74 QT Interval:  406 QTC Calculation: 412 R Axis:   66  Text Interpretation: Normal sinus rhythm Normal ECG When compared with ECG  of 09-Mar-2023 10:05, No significant change was found Confirmed by Perla Lye [phone]) on 03/10/2024 9:27:50 AM    past medical history reviewed got out of bed when she felt very lightheaded and weak.  She had to sit back down.   nauseated and noticed her heart racing. She put her head between her knees. She was drenched in perspiration but did not lose consciousness.  She walked to the bathroom and felt like she was pale when looking at herself in the mirror. She cannot identify any triggers. She slept okay the night before.  brief palpitations  [facility] cardiology evaluation at [facility] Did have 8 episodes of SVT--and occasional palpitations Did try vagal maneuver for this (with bad spell with diaphoresis) No chest pain or SOB  Echo 1. The left ventricle is normal in size with normal wall thickness.  2. The left ventricular systolic function is normal, LVEF is visually estimated at > 55%.  3. The aortic valve is trileaflet with mildly thickened leaflets with normal excursion.  4. There is mild to moderate aortic regurgitation.  5. The right ventricle is normal in size, with normal systolic function.  6. There is mild to moderate tricuspid regurgitation.  7. IVC size and inspiratory change suggest mildly elevated right atrial pressure. (5-10 mmHg).  PMH:   has a past medical history of Aortic valve insufficiency, Basal cell carcinoma (2014), Chronic venous insufficiency, Diverticulitis, Hypothyroidism, IBS (irritable bowel syndrome), Lumbar degenerative disc disease, Mood disorder (2011), Osteopenia, PSVT (paroxysmal supraventricular tachycardia), Raynaud's syndrome, and Rosacea.  PSH:    Past Surgical History:  Procedure Laterality Date  CATARACT EXTRACTION W/PHACO Left 09/24/2021   Procedure: CATARACT EXTRACTION PHACO AND INTRAOCULAR LENS PLACEMENT (IOC) LEFT VIVITY TORIC 15.68 02:02.5;  Surgeon: Mittie Gaskin, MD;  [facility];  Service: Ophthalmology;   Laterality: Left;   CATARACT EXTRACTION W/PHACO Right 10/06/2021   Procedure: CATARACT EXTRACTION PHACO AND INTRAOCULAR LENS PLACEMENT (IOC) RIGHT VIVITY TORIC 24.29 02:02.7;  Surgeon: Mittie Gaskin, MD;  Location: [facility];  Service: Ophthalmology;  Laterality: Right;   MOHS SURGERY  2017   BSC?   PILONIDAL CYST EXCISION  1958   TONSILLECTOMY AND ADENOIDECTOMY      Current Outpatient Medications  Medication Sig Dispense Refill   Azelaic Acid  15 % gel After skin is thoroughly washed and patted dry, gently but thoroughly massage a thin film of azelaic acid  cream into the affected area twice daily, in the morning and evening. 50 g 11   Calcium Carbonate-Vit D-Min (CALCIUM 600+D PLUS MINERALS) 600-400 MG-UNIT TABS Take by mouth.     denosumab (PROLIA) 60 MG/ML SOSY injection Inject 60 mg into the skin every 6 (six) months.     doxycycline  (PERIOSTAT ) 20 MG tablet TAKE 1 TABLET BY MOUTH 2 TIMES A DAY WITH FOOD AND DRINK 180 tablet 3   DULoxetine  (CYMBALTA ) 20 MG capsule Take 20 mg by mouth daily.     fexofenadine (ALLEGRA) 180 MG tablet Take 180 mg by mouth as needed.     levothyroxine  (SYNTHROID ) 75 MCG tablet TAKE 1 TABLET BY MOUTH  DAILY 90 tablet 3   Multiple Vitamins-Minerals (CENTRUM SILVER) tablet Take by mouth.     omeprazole  (PRILOSEC) 20 MG capsule TAKE 1 CAPSULE BY MOUTH DAILY AS NEEDED 90 capsule 2   propranolol (INDERAL) 10 MG tablet Take 1 tab as needed for high BP, pulse or flushing     timolol  (TIMOPTIC ) 0.5 % ophthalmic solution      triamcinolone  cream (KENALOG ) 0.1 % Apply to itchy rash 1-2 times daily until improved. Avoid face, groin, underarms. 80 g 1   No current facility-administered medications for this visit.    Allergies:   Bioflavonoids, Banana, Barley grass, Pear, Pineapple, Rice, Rye grass flower pollen extract  [gramineae pollens], Wheat, and Hydroxychloroquine   Social History:  The patient  reports that she has never smoked. She has never  been exposed to tobacco smoke. She has never used smokeless tobacco. She reports that she does not drink alcohol and does not use drugs.   Family History:   family history includes Alcohol abuse in her father; Breast cancer in her sister; Diabetes in her mother and sister; Thyroid  cancer in her daughter.   Review of Systems: Review of Systems  Constitutional: Negative.   HENT: Negative.    Respiratory: Negative.    Cardiovascular: Negative.   Gastrointestinal: Negative.   Musculoskeletal: Negative.   Neurological: Negative.   Psychiatric/Behavioral: Negative.    All other systems reviewed and are negative.  PHYSICAL EXAM: VS:  BP (!) 158/82 (BP Location: Left Arm, Patient Position: Sitting, Cuff Size: Normal)   Pulse 62   Ht 5' 6 (1.676 m)   Wt 108 lb 6 oz (49.2 kg)   BMI 17.49 kg/m  , BMI Body mass index is 17.49 kg/m. Constitutional:  oriented to person, place, and time. No distress.  HENT:  Head: Normocephalic and atraumatic.  Eyes:  no discharge. No scleral icterus.  Neck: Normal range of motion. Neck supple. No JVD present.  Cardiovascular: Normal rate, regular rhythm, normal heart sounds and intact distal pulses.  Exam reveals no gallop and no friction rub. No edema No murmur heard. Pulmonary/Chest: Effort normal and breath sounds normal. No stridor. No respiratory distress.  no wheezes.  no rales.  no tenderness.  Abdominal: Soft.  no distension.  no tenderness.  Musculoskeletal: Normal range of motion.  no  tenderness or defority.  Neurological:  normal muscle tone. Coordination normal. No atrophy Skin: Skin is warm and dry. No rash noted. not diaphoretic.  Psychiatric:  normal mood and affect. behavior is normal. Thought content normal.   Recent Labs: No results found for requested labs within last 365 days.    Lipid Panel Lab Results  Component Value Date   CHOL 187 11/12/2020   HDL 78 11/12/2020   LDLCALC 94 11/12/2020   TRIG 86 11/12/2020    Wt Readings  from Last 3 Encounters:  03/10/24 108 lb 6 oz (49.2 kg)  03/09/23 107 lb 6 oz (48.7 kg)  02/09/22 110 lb (49.9 kg)    ASSESSMENT AND PLAN:  Problem List Items Addressed This Visit       Cardiology Problems   Near syncope   Relevant Medications   propranolol (INDERAL) 10 MG tablet   SVT (supraventricular tachycardia) - Primary   Relevant Medications   propranolol (INDERAL) 10 MG tablet   Other Relevant Orders   EKG 12-Lead (Completed)     Other   Shortness of breath   Relevant Orders   EKG 12-Lead (Completed)    1. Near syncope/paroxysmal tachycardia Labile hypotension/hypertension Sometimes with orthostasis symptoms, usually improves with high salt loading and fluids Midodrine could be used for more frequent and symptomatic hypotension Zio monitor 2023, rare short episodes of tachycardia In general has been asymptomatic Has propranolol for paroxysmal tachycardia or hypertension spells  2. SVT (supraventricular tachycardia) Prior Zio patch monitor 4/11 showing 2.1% PVC burden as well as 8 episodes of narrow complex tachycardia. Relatively asymptomatic on today's visit from tachycardia  3. Aortic valve insufficiency Mild to moderate on echo October 2023, relatively unchanged No significant murmur on exam  4. HTN Labile pressure as above, no changes to her medications, sometimes low symptomatic pressures  5.  Fatigue Good exercise tolerance, goes to the gym on a regular basis  Signed, Velinda Lunger, M.D., Ph.D. [facility] [place], [place] [phone]

## 2024-03-10 ENCOUNTER — Encounter: Payer: Self-pay | Admitting: Cardiovascular Disease

## 2024-03-10 ENCOUNTER — Ambulatory Visit: Attending: Cardiovascular Disease | Admitting: Cardiovascular Disease

## 2024-03-10 VITALS — BP 158/82 | HR 62 | Ht 66.0 in | Wt 108.4 lb

## 2024-03-10 DIAGNOSIS — R55 Syncope and collapse: Secondary | ICD-10-CM | POA: Insufficient documentation

## 2024-03-10 DIAGNOSIS — I471 Supraventricular tachycardia, unspecified: Secondary | ICD-10-CM | POA: Insufficient documentation

## 2024-03-10 DIAGNOSIS — R0602 Shortness of breath: Secondary | ICD-10-CM | POA: Diagnosis present

## 2024-03-10 NOTE — Patient Instructions (Signed)

## 2024-03-29 ENCOUNTER — Ambulatory Visit

## 2024-03-29 DIAGNOSIS — L821 Other seborrheic keratosis: Secondary | ICD-10-CM | POA: Diagnosis not present

## 2024-03-29 DIAGNOSIS — L814 Other melanin hyperpigmentation: Secondary | ICD-10-CM

## 2024-03-29 DIAGNOSIS — L820 Inflamed seborrheic keratosis: Secondary | ICD-10-CM | POA: Diagnosis not present

## 2024-03-29 DIAGNOSIS — I781 Nevus, non-neoplastic: Secondary | ICD-10-CM

## 2024-03-29 DIAGNOSIS — I878 Other specified disorders of veins: Secondary | ICD-10-CM

## 2024-03-29 NOTE — Progress Notes (Signed)
" °  °  Subjective   Robyn Washington is a 89 y.o. female who presents for the following: Lesion(s) of concern . Patient is established patient   Today patient reports: Patient had cryotherapy 03/01/24 on left temple and states the areas are still bothersome and have little flake.  Also with dark spots on L temple   Review of Systems:    No other skin or systemic complaints except as noted in HPI or Assessment and Plan.  The following portions of the chart were reviewed this encounter and updated as appropriate: medications, allergies, medical history  Relevant Medical History:  Hx of rosacea   Objective  (SKPE) Well appearing patient in no apparent distress; mood and affect are within normal limits. Examination was performed of the: Focused Exam of: face   Examination notable for: Seborrheic Keratosis(es): Stuck-on appearing keratotic papule(s) on the trunk, some  irritated with redness, crusting, edema, and/or partial avulsion  Lentigines Prominent blood vessesl of L temple  Examination limited by: Undergarments, Shoes or socks , and Clothing     Assessment & Plan  (SKAP)   Benign Lesions/ Findings:  Seborrheic keratosis Lentigines Prominent veins of L temple  - Reassurance provided regarding the benign appearance of lesions noted on exam today; no treatment is indicated in the absence of symptoms/changes. - Reinforced importance of photoprotective strategies including liberal and frequent sunscreen use of a broad-spectrum SPF 30 or greater, use of protective clothing, and sun avoidance for prevention of cutaneous malignancy and photoaging.  Counseled patient on the importance of regular self-skin monitoring as well as routine clinical skin examinations as scheduled.   Was sun protection counseling provided?: No   Level of service outlined above   Patient instructions (SKPI)   Procedures, orders, diagnosis for this visit:  INFLAMED SEBORRHEIC KERATOSIS Left Temple x1 -  Destruction of lesion - Left Temple x1 Complexity: simple   Destruction method: cryotherapy   Informed consent: discussed and consent obtained   Timeout:  patient name, date of birth, surgical site, and procedure verified Lesion destroyed using liquid nitrogen: Yes   Region frozen until ice ball extended beyond lesion: Yes   Cryo cycles: 1 or 2. Outcome: patient tolerated procedure well with no complications   Post-procedure details: wound care instructions given   Additional details:  Cryotherapy Aftercare  Wash gently with soap and water everyday.   Apply Vaseline and Band-Aid daily until healed.     Inflamed seborrheic keratosis -     Destruction of lesion    Return to clinic: Return if symptoms worsen or fail to improve, for w/ Dr. Raymund.  I, Almetta Nora, RMA, am acting as scribe for Lauraine JAYSON Raymund, MD .   Documentation: I have reviewed the above documentation for accuracy and completeness, and I agree with the above.  Lauraine JAYSON Raymund, MD  "

## 2024-03-29 NOTE — Patient Instructions (Addendum)
 Cryosurgery  Cryosurgery ("freezing") uses liquid nitrogen to destroy certain types of skin lesions. Lowering the temperature of the lesion in a small area surrounding skin destroys the lesion. Immediately following cryosurgery, you will notice redness and swelling of the treatment area. Blistering or weeping may occur, lasting approximately one week which will then be followed by crusting. Most areas will heal completely in 10 to 14 days.  Wash the treated areas daily. Allow soap and water to run over the areas, but do not scrub. Should a scab or crust form, allow it to fall off on its own. Do not remove or pick at it. Application of an ointment  and a bandage may make you feel more comfortable, but it is not necessary. Some people develop an allergy to Neosporin, so we recommend that Vaseline or  Aquaphor be used.  The cryotherapy site will be more sensitive than your surrounding skin. Keep it covered, and remember to apply sunscreen every day to all your sun exposed skin. A scar may remain which is lighter or pinker than your normal skin. Your body will continue to improve your scar for up to one year; however a light-colored scar may remain.  Infection following cryotherapy is rare. However if you are worried about the appearance of the treated area, contact your doctor. We have a physician on call at all times. If you have any concerns about the site, please call our clinic at [phone]   Due to recent changes in healthcare laws, you may see results of your pathology and/or laboratory studies on MyChart before the doctors have had a chance to review them. We understand that in some cases there may be results that are confusing or concerning to you. Please understand that not all results are received at the same time and often the doctors may need to interpret multiple results in order to provide you with the best plan of care or course of treatment. Therefore, we ask that you please give us  2  business days to thoroughly review all your results before contacting the office for clarification. Should we see a critical lab result, you will be contacted sooner.   If You Need Anything After Your Visit  If you have any questions or concerns for your doctor, please call our main line at [phone] and press option 4 to reach your doctor's medical assistant. If no one answers, please leave a voicemail as directed and we will return your call as soon as possible. Messages left after 4 pm will be answered the following business day.   You may also send us  a message via MyChart. We typically respond to MyChart messages within 1-2 business days.  For prescription refills, please ask your pharmacy to contact our office. Our fax number is [phone].  If you have an urgent issue when the clinic is closed that cannot wait until the next business day, you can page your doctor at the number below.    Please note that while we do our best to be available for urgent issues outside of office hours, we are not available 24/7.   If you have an urgent issue and are unable to reach us , you may choose to seek medical care at your doctor's office, retail clinic, urgent care center, or emergency room.  If you have a medical emergency, please immediately call 911 or go to the emergency department.  Pager Numbers  - Dr. Hester: [phone]  - Dr. Jackquline: [phone]  - Dr. Claudene: [phone]   -  Dr. Raymund: [phone]  In the event of inclement weather, please call our main line at [phone] for an update on the status of any delays or closures.  Dermatology Medication Tips: Please keep the boxes that topical medications come in in order to help keep track of the instructions about where and how to use these. Pharmacies typically print the medication instructions only on the boxes and not directly on the medication tubes.   If your medication is too expensive, please contact our office  at [phone] option 4 or send us  a message through MyChart.   We are unable to tell what your co-pay for medications will be in advance as this is different depending on your insurance coverage. However, we may be able to find a substitute medication at lower cost or fill out paperwork to get insurance to cover a needed medication.   If a prior authorization is required to get your medication covered by your insurance company, please allow us  1-2 business days to complete this process.  Drug prices often vary depending on where the prescription is filled and some pharmacies may offer cheaper prices.  The website www.goodrx.com contains coupons for medications through different pharmacies. The prices here do not account for what the cost may be with help from insurance (it may be cheaper with your insurance), but the website can give you the price if you did not use any insurance.  - You can print the associated coupon and take it with your prescription to the pharmacy.  - You may also stop by our office during regular business hours and pick up a GoodRx coupon card.  - If you need your prescription sent electronically to a different pharmacy, notify our office through [facility] or by phone at [phone] option 4.     Si Usted Necesita Algo Despus de Su Visita  Tambin puede enviarnos un mensaje a travs de Clinical Cytogeneticist. Por lo general respondemos a los mensajes de MyChart en el transcurso de 1 a 2 das hbiles.  Para renovar recetas, por favor pida a su farmacia que se ponga en contacto con nuestra oficina. Randi lakes de fax es [place] [phone].  Si tiene un asunto urgente cuando la clnica est cerrada y que no puede esperar hasta el siguiente da hbil, puede llamar/localizar a su doctor(a) al nmero que aparece a continuacin.   Por favor, tenga en cuenta que aunque hacemos todo lo posible para estar disponibles para asuntos urgentes fuera del horario de [place], no estamos  disponibles las 24 horas del da, los 7 [address]  [address] de la [place].   Si tiene un problema urgente y no puede comunicarse con nosotros, puede optar por buscar atencin mdica  en el consultorio de su doctor(a), en una clnica privada, en un centro de atencin urgente o en una sala de emergencias.  Si tiene engineer, drilling, por favor llame inmediatamente al 911 o vaya a la sala de emergencias.  Nmeros de bper  - Dr. Hester: [phone]  - Dra. Jackquline: [phone]  - Dr. Claudene: [phone]  - Dra. Kitts: [phone]  En caso de inclemencias del [place], por favor llame a nuestra lnea principal al [phone] para una actualizacin sobre el estado de cualquier retraso o cierre.  Consejos para la medicacin en dermatologa: Por favor, guarde las cajas en las que vienen los medicamentos de uso tpico para ayudarle a seguir las instrucciones sobre dnde y cmo usarlos. Las farmacias generalmente imprimen las instrucciones del medicamento slo en las cajas y  no directamente en los tubos del medicamento.   Si su medicamento es muy caro, por favor, pngase en contacto con landry rieger llamando al [phone] y presione la opcin 4 o envenos un mensaje a travs de Clinical Cytogeneticist.   No podemos decirle cul ser su copago por los medicamentos por adelantado ya que esto es diferente dependiendo de la cobertura de su seguro. Sin embargo, es posible que podamos encontrar un medicamento sustituto a audiological scientist un formulario para que el seguro cubra el medicamento que se considera necesario.   Si se requiere una autorizacin previa para que su compaa de seguros cubra su medicamento, por favor permtanos de 1 a 2 das hbiles para completar este proceso.  Los precios de los medicamentos varan con frecuencia dependiendo del environmental consultant de dnde se surte la receta y alguna farmacias pueden ofrecer precios ms baratos.  El sitio web www.goodrx.com tiene cupones para medicamentos de engineer, civil (consulting). Los precios aqu no tienen en cuenta lo que podra costar con la ayuda del seguro (puede ser ms barato con su seguro), pero el sitio web puede darle el precio si no utiliz tourist information centre manager.  - Puede imprimir el cupn correspondiente y llevarlo con su receta a la farmacia.  - Tambin puede pasar por nuestra oficina durante el horario de atencin regular y education officer, museum una tarjeta de cupones de GoodRx.  - Si necesita que su receta se enve electrnicamente a una farmacia diferente, informe a nuestra oficina a travs de MyChart de  o por telfono llamando al [phone] y presione la opcin 4.

## 2024-07-31 ENCOUNTER — Ambulatory Visit: Admitting: Dermatology
